# Patient Record
Sex: Male | Born: 1990 | Race: Black or African American | Hispanic: No | Marital: Single | Smoking: Never smoker
Health system: Southern US, Community
[De-identification: ages and names within clinical notes are randomized; demographics above are authoritative.]

## PROBLEM LIST (undated history)

## (undated) DIAGNOSIS — S83519A Sprain of anterior cruciate ligament of unspecified knee, initial encounter: Secondary | ICD-10-CM

## (undated) DIAGNOSIS — Z789 Other specified health status: Secondary | ICD-10-CM

## (undated) DIAGNOSIS — N6009 Solitary cyst of unspecified breast: Secondary | ICD-10-CM

## (undated) HISTORY — PX: NO PAST SURGERIES: SHX2092

---
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
## 2011-03-03 ENCOUNTER — Emergency Department (HOSPITAL_COMMUNITY)
Admission: EM | Admit: 2011-03-03 | Discharge: 2011-03-03 | Disposition: A | Discharge status: Home / Self Care | Payer: Medicaid Other | Attending: Emergency Medicine | Admitting: Emergency Medicine

## 2011-03-03 ENCOUNTER — Emergency Department (HOSPITAL_COMMUNITY): Payer: Medicaid Other

## 2011-03-03 DIAGNOSIS — W28XXXA Contact with powered lawn mower, initial encounter: Secondary | ICD-10-CM | POA: Insufficient documentation

## 2011-03-03 DIAGNOSIS — S62639A Displaced fracture of distal phalanx of unspecified finger, initial encounter for closed fracture: Secondary | ICD-10-CM | POA: Insufficient documentation

## 2011-03-03 DIAGNOSIS — Y92009 Unspecified place in unspecified non-institutional (private) residence as the place of occurrence of the external cause: Secondary | ICD-10-CM | POA: Insufficient documentation

## 2011-03-03 DIAGNOSIS — S6000XA Contusion of unspecified finger without damage to nail, initial encounter: Secondary | ICD-10-CM | POA: Insufficient documentation

## 2011-04-16 LAB — POCT RAPID STREP A: Streptococcus, Group A Screen (Direct): NEGATIVE

## 2011-08-14 ENCOUNTER — Emergency Department (HOSPITAL_COMMUNITY)
Admission: EM | Admit: 2011-08-14 | Discharge: 2011-08-14 | Disposition: A | Discharge status: Home / Self Care | Payer: Medicaid Other | Attending: Emergency Medicine | Admitting: Emergency Medicine

## 2011-08-14 ENCOUNTER — Encounter (HOSPITAL_COMMUNITY): Payer: Self-pay

## 2011-08-14 DIAGNOSIS — M79609 Pain in unspecified limb: Secondary | ICD-10-CM | POA: Insufficient documentation

## 2011-08-14 DIAGNOSIS — L600 Ingrowing nail: Secondary | ICD-10-CM

## 2011-08-14 MED ORDER — SILVER NITRATE-POT NITRATE 75-25 % EX MISC
1.0000 | Freq: Once | CUTANEOUS | Status: AC
  Administered 2011-08-14: 1 via TOPICAL
  Filled 2011-08-14: qty 2

## 2011-08-14 MED ORDER — TRAMADOL HCL 50 MG PO TABS: 50.0000 mg | ORAL_TABLET | Freq: Four times a day (QID) | ORAL | Status: AC | PRN

## 2011-08-14 MED ORDER — LIDOCAINE HCL 2 % IJ SOLN
20.0000 mL | Freq: Once | INTRAMUSCULAR | Status: AC
  Administered 2011-08-14: 400 mg
  Filled 2011-08-14: qty 1

## 2011-08-14 NOTE — ED Notes (Signed)
Pt reports ingrown toe -rt foot great toe for several months- denies injury

## 2011-08-14 NOTE — ED Provider Notes (Signed)
History     CSN: [id number]  Arrival date & time 08/14/11  1330   First MD Initiated Contact with Patient 08/14/11 1345      Chief Complaint  Patient presents with  . Toe Pain    (Consider location/radiation/quality/duration/timing/severity/associated sxs/prior treatment) Patient is a 21 y.o. male presenting with toe pain. The history is provided by the patient.  Toe Pain This is a recurrent problem. The current episode started 1 to 4 weeks ago. The problem occurs constantly. The problem has been gradually worsening. Pertinent negatives include no chills, fever, joint swelling, numbness, rash or weakness. The symptoms are aggravated by walking. He has tried nothing for the symptoms.  Hx ingrown toenails with intermittent pain. Currently with drainage from right great toe.  History reviewed. No pertinent past medical history.  History reviewed. No pertinent past surgical history.  No family history on file.  History  Substance Use Topics  . Smoking status: Never Smoker   . Smokeless tobacco: Not on file  . Alcohol Use: No      Review of Systems  Constitutional: Negative for fever and chills.  Musculoskeletal: Negative for joint swelling and gait problem.       Right great toe pain  Skin: Positive for color change. Negative for rash.  Neurological: Negative for weakness and numbness.  All other systems reviewed and are negative.    Allergies  Review of patient's allergies indicates no known allergies.  Home Medications  No current outpatient prescriptions on file.  BP 141/76  Pulse 79  Temp(Src) 98.3 F (36.8 C) (Oral)  Resp 21  Wt 350 lb (158.759 kg)  SpO2 99%  Physical Exam  Nursing note and vitals reviewed. Constitutional: He is oriented to person, place, and time. He appears well-developed and well-nourished.       obese  HENT:  Head: Normocephalic and atraumatic.  Right Ear: External ear normal.  Left Ear: External ear normal.  Eyes: Pupils are  equal, round, and reactive to light.  Neck: Neck supple.  Cardiovascular: Normal rate and regular rhythm.   Pulmonary/Chest: Effort normal. No respiratory distress.  Abdominal: Soft. He exhibits no distension. There is no tenderness.  Musculoskeletal: Normal range of motion.       Right great toe pain with edema around nailbed- see skin exam. No other areas with tenderness edema.  Neurological: He is alert and oriented to person, place, and time.       Sensation intact to light touch  Skin: Skin is warm.       Right great toe with ingrown toenail to lateral aspect with purulent material at the cuticle. Granulation tissue to lateral cuticle.    ED Course  NERVE BLOCK Date/Time: 08/14/2011 3:20 PM Performed by: Lorenz Coaster JUSTICE Authorized by: Lorenz Coaster JUSTICE Consent: Verbal consent obtained. Risks and benefits: risks, benefits and alternatives were discussed Consent given by: patient Patient understanding: patient states understanding of the procedure being performed Patient identity confirmed: verbally with patient Time out: Immediately prior to procedure a "time out" was called to verify the correct patient, procedure, equipment, support staff and site/side marked as required. Indications: debridement Nerve block body site: right great toe. Patient sedated: no Patient position: sitting Needle gauge: 25 G Location technique: anatomical landmarks Local anesthetic: lidocaine 2% without epinephrine Anesthetic total: 2.5 ml Outcome: pain improved Patient tolerance: Patient tolerated the procedure well with no immediate complications. Comments: Re-injection required with subsequent adequate anesthesia  NAIL REMOVAL Date/Time: 08/14/2011 3:20 PM Performed by: Artis Flock,  Allanna Bresee JUSTICE Authorized by: Lorenz Coaster JUSTICE Consent: Verbal consent obtained. Risks and benefits: risks, benefits and alternatives were discussed Consent given by: patient Patient  understanding: patient states understanding of the procedure being performed Patient identity confirmed: verbally with patient Time out: Immediately prior to procedure a "time out" was called to verify the correct patient, procedure, equipment, support staff and site/side marked as required. Location: right foot Location details: right big toe Anesthesia: digital block Local anesthetic: lidocaine 2% without epinephrine Anesthetic total: 2.5 ml Patient sedated: no Preparation: skin prepped with alcohol Amount removed: 1/4 Nail removed location: lateral. Wedge excision of skin of nail fold: no Nail bed sutured: no Removed nail replaced and anchored: no Dressing: dressing applied Patient tolerance: Patient tolerated the procedure well with no immediate complications. Comments: Bleeding controlled with silver nitrate applied post-procedure. Granulation tissue from lateral nailbed removed   (including critical care time)  Labs Reviewed - No data to display No results found.   1. Ingrown right big toenail       MDM  Ingrown toenail. Partial nail removal, tolerated well. Discussed wound care with pt.        [address] [place], [place] 08/15/11 [phone]

## 2011-08-14 NOTE — ED Notes (Signed)
2nd site- ingrown toe to left great toe however "not as severe"

## 2011-08-15 NOTE — ED Provider Notes (Signed)
Medical screening examination/treatment/procedure(s) were performed by non-physician practitioner and as supervising physician I was immediately available for consultation/collaboration.  Nicholes Stairs, MD 08/15/11 1511

## 2011-08-20 ENCOUNTER — Encounter (HOSPITAL_COMMUNITY): Payer: Self-pay | Admitting: Emergency Medicine

## 2011-08-20 ENCOUNTER — Emergency Department (HOSPITAL_COMMUNITY)
Admission: EM | Admit: 2011-08-20 | Discharge: 2011-08-20 | Disposition: A | Discharge status: Home / Self Care | Payer: Medicaid Other | Attending: Emergency Medicine | Admitting: Emergency Medicine

## 2011-08-20 DIAGNOSIS — R000 Tachycardia, unspecified: Secondary | ICD-10-CM | POA: Insufficient documentation

## 2011-08-20 DIAGNOSIS — J3489 Other specified disorders of nose and nasal sinuses: Secondary | ICD-10-CM | POA: Insufficient documentation

## 2011-08-20 DIAGNOSIS — R599 Enlarged lymph nodes, unspecified: Secondary | ICD-10-CM | POA: Insufficient documentation

## 2011-08-20 DIAGNOSIS — J020 Streptococcal pharyngitis: Secondary | ICD-10-CM | POA: Insufficient documentation

## 2011-08-20 DIAGNOSIS — R509 Fever, unspecified: Secondary | ICD-10-CM | POA: Insufficient documentation

## 2011-08-20 LAB — RAPID STREP SCREEN (MED CTR MEBANE ONLY): Streptococcus, Group A Screen (Direct): POSITIVE — AB

## 2011-08-20 MED ORDER — PENICILLIN G BENZATHINE & PROC 1200000 UNIT/2ML IM SUSP: 1.2000 10*6.[IU] | Freq: Once | INTRAMUSCULAR | Status: DC

## 2011-08-20 MED ORDER — PENICILLIN G BENZATHINE 1200000 UNIT/2ML IM SUSP
1.2000 10*6.[IU] | Freq: Once | INTRAMUSCULAR | Status: AC
  Administered 2011-08-20: 1.2 10*6.[IU] via INTRAMUSCULAR
  Filled 2011-08-20: qty 2

## 2011-08-20 MED ORDER — IBUPROFEN 800 MG PO TABS
800.0000 mg | ORAL_TABLET | Freq: Once | ORAL | Status: AC
  Administered 2011-08-20: 800 mg via ORAL
  Filled 2011-08-20: qty 1

## 2011-08-20 MED ORDER — HYDROCODONE-HOMATROPINE 5-1.5 MG/5ML PO SYRP: 5.0000 mL | ORAL_SOLUTION | Freq: Four times a day (QID) | ORAL | Status: AC | PRN

## 2011-08-20 NOTE — ED Notes (Signed)
Patient and family verbalized complete understanding of all the instructions

## 2011-08-20 NOTE — ED Notes (Signed)
Pt states he has a headache, fever, sore throat, and when he swallows it makes his ears hurt, and legs are achy  Sxs started yesterday
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
## 2011-08-20 NOTE — ED Provider Notes (Signed)
Medical screening examination/treatment/procedure(s) were performed by non-physician practitioner and as supervising physician I was immediately available for consultation/collaboration.   Dayton Bailiff, MD 08/20/11 2337

## 2011-12-31 ENCOUNTER — Emergency Department (HOSPITAL_COMMUNITY)
Admission: EM | Admit: 2011-12-31 | Discharge: 2011-12-31 | Disposition: A | Discharge status: Home / Self Care | Payer: Medicaid Other | Attending: Emergency Medicine | Admitting: Emergency Medicine

## 2011-12-31 ENCOUNTER — Encounter (HOSPITAL_COMMUNITY): Payer: Self-pay | Admitting: *Deleted

## 2011-12-31 DIAGNOSIS — L600 Ingrowing nail: Secondary | ICD-10-CM

## 2011-12-31 DIAGNOSIS — IMO0002 Reserved for concepts with insufficient information to code with codable children: Secondary | ICD-10-CM

## 2011-12-31 DIAGNOSIS — L03039 Cellulitis of unspecified toe: Secondary | ICD-10-CM | POA: Insufficient documentation

## 2011-12-31 MED ORDER — CLINDAMYCIN HCL 300 MG PO CAPS: 300.0000 mg | ORAL_CAPSULE | Freq: Three times a day (TID) | ORAL | Status: AC

## 2011-12-31 NOTE — ED Provider Notes (Signed)
History     CSN: [id number]  Arrival date & time 12/31/11  1924  11:03 PM HPI Patient reports chronic bilateral toe infection and ingrown toenail  Of his great toe. Reports both toe are mildly swollen and constantly draining. Denies other injury, or fever. Reports similar symptoms in January 2013.  Patient is a 21 y.o. male presenting with lower extremity pain.  Foot Pain This is a chronic problem. The current episode started more than 1 month ago. The problem occurs constantly. The problem has been gradually worsening. Pertinent negatives include no fever, joint swelling, numbness or weakness. The symptoms are aggravated by standing and walking. He has tried nothing for the symptoms.    History reviewed. No pertinent past medical history.  History reviewed. No pertinent past surgical history.  History reviewed. No pertinent family history.  History  Substance Use Topics  . Smoking status: Never Smoker   . Smokeless tobacco: Not on file  . Alcohol Use: No      Review of Systems  Constitutional: Negative for fever.  Musculoskeletal: Negative for joint swelling.       Toe pain and infection  Neurological: Negative for weakness and numbness.  All other systems reviewed and are negative.    Allergies  Review of patient's allergies indicates no known allergies.  Home Medications  No current outpatient prescriptions on file.  BP 139/65  Pulse 75  Temp 98.3 F (36.8 C) (Oral)  Resp 16  Ht 6\' 4"  (1.93 m)  Wt 347 lb (157.398 kg)  BMI 42.24 kg/m2  SpO2 100%  Physical Exam  Vitals reviewed. Constitutional: He appears well-developed and well-nourished.  HENT:  Head: Normocephalic and atraumatic.  Eyes: Conjunctivae are normal. Pupils are equal, round, and reactive to light.  Neck: Normal range of motion. Neck supple.  Abdominal: Soft. Bowel sounds are normal.  Musculoskeletal:       Bilateral ingrown toenails with draining paronychia. Mildly tender to palpation..  Trachea appears slightly macerated as if patient has been picking at it  Skin: Skin is warm and dry. No rash noted. No erythema. No pallor.  Psychiatric: He has a normal mood and affect. His behavior is normal.    ED Course  Procedures MDM   Patient has a paronychia that are already draining. Will place patient on antibiotics if this is a chronic toe infection for several months. Also will have patient referred to a podiatrist to followup with. Mother and patient voiced understanding and are ready for discharge      Thomasene Lot, Cordelia Poche 12/31/11 2349

## 2011-12-31 NOTE — ED Notes (Signed)
Pt reports rt toe pain s/p ingrown toenail procedure x2 months ago, pt also w/ redness and drainage to rt toe - pt also reporting left toe pain as well.

## 2011-12-31 NOTE — Discharge Instructions (Signed)
Infected Ingrown Toenail  An infected ingrown toenail occurs when the nail edge grows into the skin and bacteria invade the area. Symptoms include pain, tenderness, swelling, and pus drainage from the edge of the nail. Poorly fitting shoes, minor injuries, and improper cutting of the toenail may also contribute to the problem. You should cut your toenails squarely instead of rounding the edges. Do not cut them too short. Avoid tight or pointed toe shoes. Sometimes the ingrown portion of the nail must be removed. If your toenail is removed, it can take 3-4 months for it to re-grow.  HOME CARE INSTRUCTIONS     Soak your infected toe in warm water for 20-30 minutes, 2 to 3 times a day.    Packing or dressings applied to the area should be changed daily.    Take medicine as directed and finish them.    Reduce activities and keep your foot elevated when able to reduce swelling and discomfort. Do this until the infection gets better.    Wear sandals or go barefoot as much as possible while the infected area is sensitive.    See your caregiver for follow-up care in 2-3 days if the infection is not better.   SEEK MEDICAL CARE IF:    Your toe is becoming more red, swollen or painful.  MAKE SURE YOU:     Understand these instructions.    Will watch your condition.    Will get help right away if you are not doing well or get worse.   Document Released: 08/09/2004 Document Revised: 06/21/2011 Document Reviewed: 06/28/2008  [employer] Patient Information 2012 [employer]

## 2012-01-01 NOTE — ED Provider Notes (Signed)
Medical screening examination/treatment/procedure(s) were performed by non-physician practitioner and as supervising physician I was immediately available for consultation/collaboration.   Hurman Horn, MD 01/01/12 [phone]

## 2013-06-02 ENCOUNTER — Ambulatory Visit (INDEPENDENT_AMBULATORY_CARE_PROVIDER_SITE_OTHER): Payer: Medicaid Other | Admitting: Podiatry

## 2013-06-02 ENCOUNTER — Encounter: Payer: Self-pay | Admitting: Podiatry

## 2013-06-02 VITALS — BP 142/88 | HR 78 | Resp 16 | Ht 75.0 in | Wt 360.0 lb

## 2013-06-02 DIAGNOSIS — L600 Ingrowing nail: Secondary | ICD-10-CM

## 2013-06-02 MED ORDER — NEOMYCIN-POLYMYXIN-HC 3.5-10000-1 OT SOLN: OTIC | Status: DC

## 2013-06-02 NOTE — Patient Instructions (Signed)

## 2013-06-02 NOTE — Progress Notes (Signed)
  Subjective:    Patient ID: Bryce Hilt., male    DOB: [birth year], 22 y.o.   MRN: [id number]  HPI Comments: N infected ingrown toenail, pus blood  L left great lateral corner  D 2-3 months O  C about the same  A if it gets hit  T on antibiotic       Review of Systems  All other systems reviewed and are negative.       Objective:   Physical Exam I have reviewed his past medical history medications and allergies. His review of systems is unremarkable. Vital signs are stable he is alert and oriented x3. Lower extremity exam reveals strong palpable pulses bilateral foot. Neurologic sensorium is intact per Semmes-Weinstein monofilament. Deep tendon reflexes are intact and brisk bilateral. Muscle strength + over 5 dorsiflexors plantar flexors inverters and evertors are intact. Intrinsic musculature is intact. Orthopedic evaluation demonstrates mild pes planus noted bilateral. Cutaneous evaluation demonstrates supple well hydrated cutis no erythema edema cellulitis drainage or odor with exception of the fibular border hallux left. This does demonstrate a sharp incurvated nail margin along the fibular border of the hallux left. Sharp incurvated nail margin also demonstrating erythema granulation tissue purulence and malodor.        Assessment & Plan:  Assessment: Ingrown nail paronychia abscess hallux left fibular border.  Plan: Matrixectomy was performed to the fibular border of the hallux left today. This was performed after 3 cc of a 50-50 mixture of Marcaine plain and lidocaine plain was infiltrated in a hallux block left. The toe was then prepped and draped in is normal sterile fashion. Matrixectomy was performed by splitting the nail from distal to proximal along the fibular border. All necrotic tissue was sharply resected. 3 applications of phenol were applied to the matrix and nailbed. It was neutralized with isopropyl alcohol. Silvadene cream Telfa pad and a dry sterile  compressive dressing was applied. He was her soaking on a twice a day basis and Betadine and water starting tomorrow. He was given a prescription for Cortisporin Otic and a sheet for soaking instructions. I will followup with him in one week.

## 2013-06-09 ENCOUNTER — Ambulatory Visit: Payer: Medicaid Other | Admitting: Podiatry

## 2014-11-09 ENCOUNTER — Encounter (HOSPITAL_COMMUNITY): Payer: Self-pay | Admitting: Emergency Medicine

## 2014-11-09 ENCOUNTER — Emergency Department (HOSPITAL_COMMUNITY)
Admission: EM | Admit: 2014-11-09 | Discharge: 2014-11-09 | Disposition: A | Discharge status: Home / Self Care | Payer: Medicaid Other | Attending: Emergency Medicine | Admitting: Emergency Medicine

## 2014-11-09 DIAGNOSIS — M546 Pain in thoracic spine: Secondary | ICD-10-CM | POA: Insufficient documentation

## 2014-11-09 DIAGNOSIS — R109 Unspecified abdominal pain: Secondary | ICD-10-CM | POA: Insufficient documentation

## 2014-11-09 LAB — URINALYSIS, ROUTINE W REFLEX MICROSCOPIC
BILIRUBIN URINE: NEGATIVE
Glucose, UA: NEGATIVE mg/dL
HGB URINE DIPSTICK: NEGATIVE
KETONES UR: NEGATIVE mg/dL
LEUKOCYTES UA: NEGATIVE
Nitrite: NEGATIVE
PROTEIN: NEGATIVE mg/dL
SPECIFIC GRAVITY, URINE: 1.024 (ref 1.005–1.030)
UROBILINOGEN UA: 0.2 mg/dL (ref 0.0–1.0)
pH: 6.5 (ref 5.0–8.0)

## 2014-11-09 LAB — BASIC METABOLIC PANEL
Anion gap: 3 — ABNORMAL LOW (ref 5–15)
BUN: 17 mg/dL (ref 6–23)
CALCIUM: 9.2 mg/dL (ref 8.4–10.5)
CHLORIDE: 109 mmol/L (ref 96–112)
CO2: 24 mmol/L (ref 19–32)
Creatinine, Ser: 0.97 mg/dL (ref 0.50–1.35)
GFR calc non Af Amer: >90 mL/min (ref >90)
Glucose, Bld: 105 mg/dL — ABNORMAL HIGH (ref 70–99)
Potassium: 4 mmol/L (ref 3.5–5.1)
Sodium: 136 mmol/L (ref 135–145)

## 2014-11-09 LAB — CBC
HEMATOCRIT: 44.7 % (ref 39.0–52.0)
Hemoglobin: 15.3 g/dL (ref 13.0–17.0)
MCH: 29.9 pg (ref 26.0–34.0)
MCHC: 34.2 g/dL (ref 30.0–36.0)
MCV: 87.5 fL (ref 78.0–100.0)
Platelets: 189 10*3/uL (ref 150–400)
RBC: 5.11 MIL/uL (ref 4.22–5.81)
RDW: 13.1 % (ref 11.5–15.5)
WBC: 9.7 10*3/uL (ref 4.0–10.5)

## 2014-11-09 MED ORDER — METHOCARBAMOL 500 MG PO TABS: 500.0000 mg | ORAL_TABLET | Freq: Two times a day (BID) | ORAL | Status: DC

## 2014-11-09 MED ORDER — ONDANSETRON HCL 4 MG/2ML IJ SOLN
4.0000 mg | Freq: Once | INTRAMUSCULAR | Status: AC
  Administered 2014-11-09: 4 mg via INTRAVENOUS
  Filled 2014-11-09: qty 2

## 2014-11-09 MED ORDER — IBUPROFEN 600 MG PO TABS: 600.0000 mg | ORAL_TABLET | Freq: Four times a day (QID) | ORAL | Status: DC | PRN

## 2014-11-09 MED ORDER — KETOROLAC TROMETHAMINE 30 MG/ML IJ SOLN
30.0000 mg | Freq: Once | INTRAMUSCULAR | Status: AC
  Administered 2014-11-09: 30 mg via INTRAVENOUS
  Filled 2014-11-09: qty 1

## 2014-11-09 NOTE — ED Provider Notes (Signed)
CSN: [id number]     Arrival date & time 11/09/14  0702 History   First MD Initiated Contact with Patient 11/09/14 [phone]     Chief Complaint  Patient presents with  . Flank Pain     (Consider location/radiation/quality/duration/timing/severity/associated sxs/prior Treatment) HPI Comments: Patient is a 24 yo M presenting to the ED for evaluation for left sided flank pain with radiation into his abdomen that has been waxing and waning since Sunday. Describes it as sharp. Tried taking a vicodin with no improvement of symptoms. No modifying factors identified. Denies any fevers, chills, nausea, vomiting, diarrhea, constipation, urinary symptoms, shortness of breath. No abdominal surgical history.  Patient is a 24 y.o. male presenting with flank pain.  Flank Pain    History reviewed. No pertinent past medical history. History reviewed. No pertinent past surgical history. History reviewed. No pertinent family history. History  Substance Use Topics  . Smoking status: Never Smoker   . Smokeless tobacco: Never Used  . Alcohol Use: No    Review of Systems  Genitourinary: Positive for flank pain.  All other systems reviewed and are negative.     Allergies  Review of patient's allergies indicates no known allergies.  Home Medications   Prior to Admission medications   Medication Sig Start Date End Date Taking? Authorizing Provider  ibuprofen (ADVIL,MOTRIN) 600 MG tablet Take 1 tablet (600 mg total) by mouth every 6 (six) hours as needed. 11/09/14   Houston Surges, PA-C  methocarbamol (ROBAXIN) 500 MG tablet Take 1 tablet (500 mg total) by mouth 2 (two) times daily. 11/09/14   Francee PiccoloJennifer Averleigh Savary, PA-C  neomycin-polymyxin-hydrocortisone (CORTISPORIN) otic solution 1-2 drops to toe after soaking twice daily Patient not taking: Reported on 11/09/2014 06/02/13   Max T Hyatt, DPM   BP 146/81 mmHg  Pulse 65  Temp(Src) 98.2 F (36.8 C) (Oral)  Resp 16  Ht 6\' 3"  (1.905 m)  Wt 365 lb  (165.563 kg)  BMI 45.62 kg/m2  SpO2 97% Physical Exam  Constitutional: He is oriented to person, place, and time. He appears well-developed and well-nourished. No distress.  HENT:  Head: Normocephalic and atraumatic.  Right Ear: External ear normal.  Left Ear: External ear normal.  Nose: Nose normal.  Mouth/Throat: Oropharynx is clear and moist.  Eyes: Conjunctivae are normal.  Neck: Normal range of motion. Neck supple.  No nuchal rigidity.   Cardiovascular: Normal rate, regular rhythm and normal heart sounds.   Pulmonary/Chest: Effort normal and breath sounds normal. No respiratory distress.  Abdominal: Soft. Bowel sounds are normal. There is no tenderness.  Musculoskeletal: Normal range of motion.       Thoracic back: He exhibits no bony tenderness and no deformity.       Back:  Neurological: He is alert and oriented to person, place, and time.  Skin: Skin is warm and dry. He is not diaphoretic.  Psychiatric: He has a normal mood and affect.  Nursing note and vitals reviewed.   ED Course  Procedures (including critical care time) Medications  ondansetron (ZOFRAN) injection 4 mg (4 mg Intravenous Given 11/09/14 0755)  ketorolac (TORADOL) 30 MG/ML injection 30 mg (30 mg Intravenous Given 11/09/14 0756)    Labs Review Labs Reviewed  BASIC METABOLIC PANEL - Abnormal; Notable for the following:    Glucose, Bld 105 (*)    Anion gap 3 (*)    All other components within normal limits  URINE CULTURE  URINALYSIS, ROUTINE W REFLEX MICROSCOPIC  CBC    Imaging Review  No results found.   EKG Interpretation None      MDM   Final diagnoses:  Left flank pain   Filed Vitals:   11/09/14 0906  BP: 146/81  Pulse: 65  Temp: 98.2 F (36.8 C)  Resp: 16   Afebrile, NAD, non-toxic appearing, AAOx4.  I have reviewed nursing notes, vital signs, and all appropriate lab and imaging results for this patient.  Abdomen soft, nontender, nondistended. No CVA tenderness. Left sided  mid back tenderness appreciated. No bony tenderness. No evidence of UTI on UA. No hemoglobin in UA to suggest kidney stone. No red flags for back pain, likely musculoskeletal in nature. Pain managed and improved in emergency department. Return precautions discussed. Advised PCP follow-up. Patient is agreeable to plan. Patient is stable at time of discharge     Francee Piccolo, PA-C 11/09/14 0940  Lorre Nick, MD 11/11/14 [phone]

## 2014-11-09 NOTE — ED Notes (Signed)
Pt denies pain or n/v during and post-void.

## 2014-11-09 NOTE — ED Notes (Signed)
Pt states he has pain in his left flank area that radiates across his abdomen  Pt states the pain is sharp and is constant  Pt states the pain started on Sunday night  Pt states it was so bad earlier this morning he could not stand up straight  Denies N/V/D

## 2014-11-09 NOTE — Discharge Instructions (Signed)
Please follow up with your primary care physician in 1-2 days. If you do not have one please call the [facility] number listed above. Please take pain medication and/or muscle relaxants as prescribed and as needed for pain. Please do not drive on narcotic pain medication or on muscle relaxants. Please read all discharge instructions and return precautions.  ° ° °Flank Pain °Flank pain refers to pain that is located on the side of the body between the upper abdomen and the back. The pain may occur over a short period of time (acute) or may be long-term or reoccurring (chronic). It may be mild or severe. Flank pain can be caused by many things. °CAUSES  °Some of the more common causes of flank pain include: °· Muscle strains.   °· Muscle spasms.   °· A disease of your spine (vertebral disk disease).   °· A lung infection (pneumonia).   °· Fluid around your lungs (pulmonary edema).   °· A kidney infection.   °· Kidney stones.   °· A very painful skin rash caused by the chickenpox virus (shingles).   °· Gallbladder disease.   °HOME CARE INSTRUCTIONS  °Home care will depend on the cause of your pain. In general, °· Rest as directed by your caregiver. °· Drink enough fluids to keep your urine clear or pale yellow. °· Only take over-the-counter or prescription medicines as directed by your caregiver. Some medicines may help relieve the pain. °· Tell your caregiver about any changes in your pain. °· Follow up with your caregiver as directed. °SEEK IMMEDIATE MEDICAL CARE IF:  °· Your pain is not controlled with medicine.   °· You have new or worsening symptoms. °· Your pain increases.   °· You have abdominal pain.   °· You have shortness of breath.   °· You have persistent nausea or vomiting.   °· You have swelling in your abdomen.   °· You feel faint or pass out.   °· You have blood in your urine. °· You have a fever or persistent symptoms for more than 2-3 days. °· You have a fever and your symptoms  suddenly get worse. °MAKE SURE YOU:  °· Understand these instructions. °· Will watch your condition. °· Will get help right away if you are not doing well or get worse. °Document Released: 08/23/2005 Document Revised: 03/26/2012 Document Reviewed: 02/14/2012 °ExitCare® Patient Information ©2015 [employer]. This information is not intended to replace advice given to you by your health care provider. Make sure you discuss any questions you have with your health care provider. ° ° ° °

## 2014-11-10 LAB — URINE CULTURE
COLONY COUNT: NO GROWTH
CULTURE: NO GROWTH

## 2014-12-11 ENCOUNTER — Emergency Department (HOSPITAL_COMMUNITY)
Admission: EM | Admit: 2014-12-11 | Discharge: 2014-12-11 | Disposition: A | Discharge status: Home / Self Care | Payer: Medicaid Other | Attending: Emergency Medicine | Admitting: Emergency Medicine

## 2014-12-11 ENCOUNTER — Encounter (HOSPITAL_COMMUNITY): Payer: Self-pay | Admitting: Emergency Medicine

## 2014-12-11 DIAGNOSIS — L02411 Cutaneous abscess of right axilla: Secondary | ICD-10-CM

## 2014-12-11 DIAGNOSIS — Z79899 Other long term (current) drug therapy: Secondary | ICD-10-CM | POA: Insufficient documentation

## 2014-12-11 MED ORDER — IBUPROFEN 800 MG PO TABS
800.0000 mg | ORAL_TABLET | Freq: Once | ORAL | Status: AC
  Administered 2014-12-11: 800 mg via ORAL
  Filled 2014-12-11: qty 1

## 2014-12-11 MED ORDER — LIDOCAINE-PRILOCAINE 2.5-2.5 % EX CREA
TOPICAL_CREAM | Freq: Once | CUTANEOUS | Status: AC
  Administered 2014-12-11: 13:00:00 via TOPICAL
  Filled 2014-12-11: qty 5

## 2014-12-11 NOTE — ED Provider Notes (Signed)
CSN: [id number]     Arrival date & time 12/11/14  1215 History  This chart was scribed for Bryce Piccolo, PA-C working with Arby Barrette, MD by Elveria Rising, ED Scribe. This patient was seen in room WTR7/WTR7 and the patient's care was started at 12:34 PM.   Chief Complaint  Patient presents with  . Animal Bite   Patient is a 24 y.o. male presenting with abscess. The history is provided by the patient. No language interpreter was used.  Abscess Location:  Shoulder/arm Shoulder/arm abscess location:  R axilla Abscess quality: painful   Abscess quality: not draining   Duration:  3 days Context: not diabetes and not insect bite/sting   Associated symptoms: no fever, no nausea and no vomiting   Risk factors: no prior abscess    HPI Comments: Bryce Carroll is a 24 y.o. male who presents to the Emergency Department complaining of abscess to right axillary region which developed 3-4 days ago. Patient reports pain and swelling at the site; exacerbated pain with movement. Patient states that he's puncture the area with needles. Patient denies drainage, fever, chest pain, nausea or vomiting. Patient denies chronic illnesses.   History reviewed. No pertinent past medical history. History reviewed. No pertinent past surgical history. No family history on file. History  Substance Use Topics  . Smoking status: Never Smoker   . Smokeless tobacco: Never Used  . Alcohol Use: No    Review of Systems  Constitutional: Negative for fever.  Cardiovascular: Negative for chest pain.  Gastrointestinal: Negative for nausea and vomiting.  Skin:       Abscess   All other systems reviewed and are negative.     Allergies  Review of patient's allergies indicates no known allergies.  Home Medications   Prior to Admission medications   Medication Sig Start Date End Date Taking? Authorizing Provider  ibuprofen (ADVIL,MOTRIN) 600 MG tablet Take 1 tablet (600 mg total) by mouth every 6 (six)  hours as needed. 11/09/14   Sybella Harnish, PA-C  methocarbamol (ROBAXIN) 500 MG tablet Take 1 tablet (500 mg total) by mouth 2 (two) times daily. 11/09/14   Bryce Piccolo, PA-C  neomycin-polymyxin-hydrocortisone (CORTISPORIN) otic solution 1-2 drops to toe after soaking twice daily Patient not taking: Reported on 11/09/2014 06/02/13   Max T Hyatt, DPM   Triage Vitals: BP 158/74 mmHg  Pulse 94  Temp(Src) 98.1 F (36.7 C) (Oral)  Resp 16  SpO2 98% Physical Exam  Constitutional: He is oriented to person, place, and time. He appears well-developed and well-nourished. No distress.  HENT:  Head: Normocephalic and atraumatic.  Eyes: Conjunctivae and EOM are normal.  Neck: Neck supple. No tracheal deviation present.  Cardiovascular: Normal rate, regular rhythm and normal heart sounds.   Pulmonary/Chest: Effort normal and breath sounds normal. No respiratory distress.  Musculoskeletal: Normal range of motion.  Neurological: He is alert and oriented to person, place, and time.  Skin: Skin is warm and dry.     Psychiatric: He has a normal mood and affect. His behavior is normal.  Nursing note and vitals reviewed.   ED Course  Procedures (including critical care time) Medications  lidocaine-prilocaine (EMLA) cream ( Topical Given 12/11/14 1257)  ibuprofen (ADVIL,MOTRIN) tablet 800 mg (800 mg Oral Given 12/11/14 1253)    COORDINATION OF CARE: 12:37 PM- Discussed treatment plan with patient at bedside and patient agreed to plan.   Labs Review Labs Reviewed - No data to display  Imaging Review No results found.  EKG Interpretation None      INCISION AND DRAINAGE Performed by: Bryce PiccoloPIEPENBRINK, Lummie Montijo L Consent: Verbal consent obtained. Risks and benefits: risks, benefits and alternatives were discussed Type: abscess  Body area: right axilla  Anesthesia: topical  Incision was made with a scalpel.  Local anesthetic: EMLA  Anesthetic total: NA  Complexity:  complex Blunt dissection to break up loculations  Drainage: purulent  Drainage amount: copious  Packing material: NA  Patient tolerance: Patient tolerated the procedure well with no immediate complications.    MDM   Final diagnoses:  Abscess of right axilla    Filed Vitals:   12/11/14 1226  BP: 158/74  Pulse: 94  Temp: 98.1 F (36.7 C)  Resp: 16   Afebrile, NAD, non-toxic appearing, AAOx4.   Patient with skin abscess amenable to incision and drainage.  Abscess was not large enough to warrant packing or drain,  wound recheck in 2 days. Encouraged home warm soaks and flushing.  Mild signs of cellulitis is surrounding skin.  Will d/c to home.  No antibiotic therapy is indicated.   I personally performed the services described in this documentation, which was scribed in my presence. The recorded information has been reviewed and is accurate.     Bryce PiccoloJennifer Alita Waldren, PA-C 12/11/14 1340  Arby BarretteMarcy Pfeiffer, MD 12/12/14 [phone]

## 2014-12-11 NOTE — ED Notes (Signed)
Pt c/o possible animal bite after sleeping on a relative's floor three days ago. Pt concerned it was a mouse. Pt sts he woke up and his shirt was "eaten through" and he has had a red bump under his R arm ever since. Pt c/o 8/10 pain when moving his R arm. Pt A&Ox4 and ambulatory. No other complaints at this time.

## 2014-12-11 NOTE — Discharge Instructions (Signed)
Please follow up with your primary care physician in 1-2 days. If you do not have one [facility]. Please read all discharge instructions and return precautions.    Abscess Care After An abscess (also called a boil or furuncle) is an infected area that contains a collection of pus. Signs and symptoms of an abscess include pain, tenderness, redness, or hardness, or you may feel a moveable soft area under your skin. An abscess can occur anywhere in the body. The infection may spread to surrounding tissues causing cellulitis. A cut (incision) by the surgeon was made over your abscess and the pus was drained out. Gauze may have been packed into the space to provide a drain that will allow the cavity to heal from the inside outwards. The boil may be painful for 5 to 7 days. Most people with a boil do not have high fevers. Your abscess, if seen early, may not have localized, and may not have been lanced. If not, another appointment may be required for this if it does not get better on its own or with medications. HOME CARE INSTRUCTIONS   Only take over-the-counter or prescription medicines for pain, discomfort, or fever as directed by your caregiver.  When you bathe, soak and then remove gauze or iodoform packs at least daily or as directed by your caregiver. You may then wash the wound gently with mild soapy water. Repack with gauze or do as your caregiver directs. SEEK IMMEDIATE MEDICAL CARE IF:   You develop increased pain, swelling, redness, drainage, or bleeding in the wound site.  You develop signs of generalized infection including muscle aches, chills, fever, or a general ill feeling.  An oral temperature above 102 F (38.9 C) develops, not controlled by medication. See your caregiver for a recheck if you develop any of the symptoms described above. If medications (antibiotics) were prescribed, take them as directed. Document Released:  01/18/2005 Document Revised: 09/24/2011 Document Reviewed: 09/15/2007 [facility] Patient Information 2015 [place], [place]. This information is not intended to replace advice given to you by your health care provider. Make sure you discuss any questions you have with your health care provider.

## 2014-12-19 ENCOUNTER — Encounter (HOSPITAL_COMMUNITY): Payer: Self-pay | Admitting: Emergency Medicine

## 2014-12-19 ENCOUNTER — Inpatient Hospital Stay (HOSPITAL_COMMUNITY)
Admission: EM | Admit: 2014-12-19 | Discharge: 2014-12-20 | DRG: 603 | Disposition: A | Discharge status: Home / Self Care | Payer: Self-pay | Attending: Internal Medicine | Admitting: Internal Medicine

## 2014-12-19 DIAGNOSIS — Z6841 Body Mass Index (BMI) 40.0 and over, adult: Secondary | ICD-10-CM

## 2014-12-19 DIAGNOSIS — L02213 Cutaneous abscess of chest wall: Principal | ICD-10-CM | POA: Diagnosis present

## 2014-12-19 DIAGNOSIS — L0291 Cutaneous abscess, unspecified: Secondary | ICD-10-CM | POA: Insufficient documentation

## 2014-12-19 DIAGNOSIS — L0390 Cellulitis, unspecified: Secondary | ICD-10-CM

## 2014-12-19 DIAGNOSIS — E669 Obesity, unspecified: Secondary | ICD-10-CM | POA: Diagnosis present

## 2014-12-19 DIAGNOSIS — L03313 Cellulitis of chest wall: Secondary | ICD-10-CM | POA: Diagnosis present

## 2014-12-19 HISTORY — DX: Solitary cyst of unspecified breast: N60.09

## 2014-12-19 HISTORY — DX: Other specified health status: Z78.9

## 2014-12-19 LAB — CBC WITH DIFFERENTIAL/PLATELET
Basophils Absolute: 0 10*3/uL (ref 0.0–0.1)
Basophils Relative: 0 % (ref 0–1)
Eosinophils Absolute: 0.1 10*3/uL (ref 0.0–0.7)
Eosinophils Relative: 1 % (ref 0–5)
HEMATOCRIT: 43.1 % (ref 39.0–52.0)
Hemoglobin: 15 g/dL (ref 13.0–17.0)
Lymphocytes Relative: 19 % (ref 12–46)
Lymphs Abs: 3.2 10*3/uL (ref 0.7–4.0)
MCH: 30 pg (ref 26.0–34.0)
MCHC: 34.8 g/dL (ref 30.0–36.0)
MCV: 86.2 fL (ref 78.0–100.0)
Monocytes Absolute: 1.7 10*3/uL — ABNORMAL HIGH (ref 0.1–1.0)
Monocytes Relative: 10 % (ref 3–12)
Neutro Abs: 11.7 10*3/uL — ABNORMAL HIGH (ref 1.7–7.7)
Neutrophils Relative %: 70 % (ref 43–77)
Platelets: 229 10*3/uL (ref 150–400)
RBC: 5 MIL/uL (ref 4.22–5.81)
RDW: 12.9 % (ref 11.5–15.5)
WBC: 16.7 10*3/uL — AB (ref 4.0–10.5)

## 2014-12-19 LAB — BASIC METABOLIC PANEL
Anion gap: 11 (ref 5–15)
BUN: 14 mg/dL (ref 6–20)
CALCIUM: 9.2 mg/dL (ref 8.9–10.3)
CHLORIDE: 101 mmol/L (ref 101–111)
CO2: 24 mmol/L (ref 22–32)
Creatinine, Ser: 1.03 mg/dL (ref 0.61–1.24)
GFR calc Af Amer: >60 mL/min (ref >60)
GFR calc non Af Amer: >60 mL/min (ref >60)
Glucose, Bld: 89 mg/dL (ref 65–99)
POTASSIUM: 4.6 mmol/L (ref 3.5–5.1)
Sodium: 136 mmol/L (ref 135–145)

## 2014-12-19 LAB — MRSA PCR SCREENING: MRSA by PCR: NEGATIVE

## 2014-12-19 MED ORDER — ENOXAPARIN SODIUM 40 MG/0.4ML ~~LOC~~ SOLN
40.0000 mg | SUBCUTANEOUS | Status: DC
  Administered 2014-12-19: 40 mg via SUBCUTANEOUS
  Filled 2014-12-19 (×2): qty 0.4

## 2014-12-19 MED ORDER — SODIUM CHLORIDE 0.9 % IV SOLN
1250.0000 mg | Freq: Three times a day (TID) | INTRAVENOUS | Status: DC
  Administered 2014-12-20 (×2): 1250 mg via INTRAVENOUS
  Filled 2014-12-19 (×4): qty 1250

## 2014-12-19 MED ORDER — ONDANSETRON HCL 4 MG/2ML IJ SOLN: 4.0000 mg | Freq: Four times a day (QID) | INTRAMUSCULAR | Status: DC | PRN

## 2014-12-19 MED ORDER — ACETAMINOPHEN 325 MG PO TABS: 650.0000 mg | ORAL_TABLET | Freq: Four times a day (QID) | ORAL | Status: DC | PRN

## 2014-12-19 MED ORDER — ONDANSETRON HCL 4 MG PO TABS: 4.0000 mg | ORAL_TABLET | Freq: Four times a day (QID) | ORAL | Status: DC | PRN

## 2014-12-19 MED ORDER — LIDOCAINE-EPINEPHRINE (PF) 2 %-1:200000 IJ SOLN
20.0000 mL | Freq: Once | INTRAMUSCULAR | Status: AC
  Administered 2014-12-19: 20 mL
  Filled 2014-12-19: qty 20

## 2014-12-19 MED ORDER — TRAMADOL HCL 50 MG PO TABS
50.0000 mg | ORAL_TABLET | Freq: Four times a day (QID) | ORAL | Status: DC | PRN
  Administered 2014-12-19: 50 mg via ORAL
  Filled 2014-12-19: qty 1

## 2014-12-19 MED ORDER — ACETAMINOPHEN 650 MG RE SUPP: 650.0000 mg | Freq: Four times a day (QID) | RECTAL | Status: DC | PRN

## 2014-12-19 MED ORDER — SODIUM CHLORIDE 0.9 % IV SOLN
INTRAVENOUS | Status: DC
Start: 2014-12-19 — End: 2014-12-20
  Administered 2014-12-20: 10:00:00 via INTRAVENOUS

## 2014-12-19 MED ORDER — VANCOMYCIN HCL 10 G IV SOLR
2000.0000 mg | Freq: Once | INTRAVENOUS | Status: AC
  Administered 2014-12-19: 2000 mg via INTRAVENOUS
  Filled 2014-12-19: qty 2000

## 2014-12-19 NOTE — Progress Notes (Signed)
ANTIBIOTIC CONSULT NOTE - INITIAL  Pharmacy Consult for Vancomycin Indication: Cellulitis  No Known Allergies  Patient Measurements: Height: 6\' 3"  (190.5 cm) Weight: (!) 365 lb (165.563 kg) IBW/kg (Calculated) : 84.5  Vital Signs: Temp: 98.7 F (37.1 C) (06/05 1706) Temp Source: Oral (06/05 1706) BP: 145/80 mmHg (06/05 1706) Pulse Rate: 106 (06/05 1706) Intake/Output from previous day:   Intake/Output from this shift:    Labs: No results for input(s): WBC, HGB, PLT, LABCREA, CREATININE in the last 72 hours. CrCl cannot be calculated (Patient has no serum creatinine result on file.). No results for input(s): VANCOTROUGH, VANCOPEAK, VANCORANDOM, GENTTROUGH, GENTPEAK, GENTRANDOM, TOBRATROUGH, TOBRAPEAK, TOBRARND, AMIKACINPEAK, AMIKACINTROU, AMIKACIN in the last 72 hours.   Microbiology: No results found for this or any previous visit (from the past 720 hour(s)).  Medical History: Past Medical History  Diagnosis Date  . Cyst (solitary) of breast    Medications:  Scheduled:   Anti-infectives    Start     Dose/Rate Route Frequency Ordered Stop   12/19/14 1800  vancomycin (VANCOCIN) 2,000 mg in sodium chloride 0.9 % 500 mL IVPB     2,000 mg 250 mL/hr over 120 Minutes Intravenous  Once 12/19/14 1751       Assessment: 23 yoM with cellulitis of chest, axilla. Plan I&D of purulent area. Vancomycin dosing per Pharmacy.   Renal function adequate, patient obese  Goal of Therapy:  Vancomycin trough level 10-15 mcg/ml  Plan:   Follow 2gm Vancomycin with 1250mg  IV q8hr  Follow culture, renal function  Otho BellowsGreen, Demetrice Amstutz L PharmD Pager [phone] 12/19/2014, 6:01 PM

## 2014-12-19 NOTE — ED Notes (Signed)
Pt has noticed a red area to RT chest 2 days ago, has a Pimp like appearance with large 4x4 boarder of erythema to chest . Pain , denies an/v/d no fever. Has had this x1 in the past. Has not taken anything for area.

## 2014-12-19 NOTE — ED Notes (Signed)
Patient ready for I&D. Erythematous area to right side under axilla noted. Patient states area has progressively increased in size over the past three days. In no acute distress

## 2014-12-19 NOTE — H&P (Signed)
[facility] History and Physical  FREDRIC SLABACH UEA:[id number] DOB: 02-29-1991 DOA: 12/19/2014  Referring physician: Ms.Heather. PCP: Dorrene German, MD  Specialists: None.  Chief Complaint: Right chest wall cellulitis.  HPI: Bryce Carroll is a 24 y.o. male with no significant past medical history presents to the ER because of worsening swelling erythema around the right chest wall around his right breast. Patient had come to the ER 2 weeks ago with abscesses in his axilla which was drained. In the ER patient's right chest was found to be tender erythematous and had a small abscess which was drained. Since the area involved is large patient has been admitted for IV antibiotics. Patient at this time does not look toxic and is able to move his extremities without difficulty. There's mild tenderness on the right chest wall.   Review of Systems: As presented in the history of presenting illness, rest negative.  Past Medical History  Diagnosis Date  . Cyst (solitary) of breast   . Medical history non-contributory    Past Surgical History  Procedure Laterality Date  . No past surgeries     Social History:  reports that he has never smoked. He has never used smokeless tobacco. He reports that he does not drink alcohol or use illicit drugs. Where does patient live home. Can patient participate in ADLs? Yes.  No Known Allergies  Family History:  Family History  Problem Relation Age of Onset  . Diabetes Mellitus II Neg Hx       Prior to Admission medications   Medication Sig Start Date End Date Taking? Authorizing Provider  naproxen sodium (ANAPROX) 220 MG tablet Take 440 mg by mouth daily as needed (pain).   Yes Historical Provider, MD  ibuprofen (ADVIL,MOTRIN) 600 MG tablet Take 1 tablet (600 mg total) by mouth every 6 (six) hours as needed. Patient not taking: Reported on 12/19/2014 11/09/14   Francee Piccolo, PA-C  methocarbamol (ROBAXIN) 500 MG tablet Take 1 tablet  (500 mg total) by mouth 2 (two) times daily. Patient not taking: Reported on 12/19/2014 11/09/14   Francee Piccolo, PA-C  neomycin-polymyxin-hydrocortisone (CORTISPORIN) otic solution 1-2 drops to toe after soaking twice daily Patient not taking: Reported on 11/09/2014 06/02/13   Max Maud Deed, DPM    Physical Exam: Filed Vitals:   12/19/14 1426 12/19/14 1706 12/19/14 1934  BP: 136/72 145/80 145/67  Pulse: 98 106 99  Temp: 98.7 F (37.1 C) 98.7 F (37.1 C)   TempSrc: Oral Oral   Resp: Height:  (1.905 m)    Weight: 165.563 kg (365 lb)    SpO2: 100% 96% 98%     General:  Obese not in distress.  Eyes: Anicteric no pallor.  ENT: No discharge from the ears eyes nose and mouth.  Neck: No mass felt.  Cardiovascular: S1-S2 heard.  Respiratory: No rhonchi or crepitations.  Abdomen: Soft nontender bowel sounds present.  Skin: Right chest around the lateral aspect of his right breast is being dressed. There is mild tenderness around the surrounding skin area.  Musculoskeletal: No edema.  Psychiatric: Appears normal.  Neurologic: Alert awake oriented to time place and person. Moves all extremities.  Labs on Admission:  Basic Metabolic Panel:  Recent Labs Lab 12/19/14 1819  NA 136  K 4.6  CL 101  CO2 24  GLUCOSE 89  BUN 14  CREATININE 1.03  CALCIUM 9.2   Liver Function Tests: No results for input(s): AST, ALT, ALKPHOS, BILITOT, PROT, ALBUMIN  in the last 168 hours. No results for input(s): LIPASE, AMYLASE in the last 168 hours. No results for input(s): AMMONIA in the last 168 hours. CBC:  Recent Labs Lab 12/19/14 1819  WBC 16.7*  NEUTROABS 11.7*  HGB 15.0  HCT 43.1  MCV 86.2  PLT 229   Cardiac Enzymes: No results for input(s): CKTOTAL, CKMB, CKMBINDEX, TROPONINI in the last 168 hours.  BNP (last 3 results) No results for input(s): BNP in the last 8760 hours.  ProBNP (last 3 results) No results for input(s): PROBNP in the last 8760  hours.  CBG: No results for input(s): GLUCAP in the last 168 hours.  Radiological Exams on Admission: No results found.   Assessment/Plan Principal Problem:   Cellulitis of chest wall   1. Cellulitis of the chest wall, right side - patient has already had incision and drainage done in the ER by the ER physician. I have ordered CT chest to look for the extent of the abscess and if there is any remaining abscess may need further debridement. Patient does not look toxic at this time. I have placed patient on IV vancomycin for now. Unfortunately wound culture was not sent. Closely observe.   DVT Prophylaxis Lovenox.  Code Status: Full code.  Family Communication: Discussed with patient.  Disposition Plan: Admit to inpatient. Likely stay 2 days.    KAKRAKANDY,ARSHAD N. [facility] Pager [phone].  If 7PM-7AM, please contact night-coverage [email or web address] Password [email or web address] 12/19/2014, 9:03 PM

## 2014-12-19 NOTE — ED Provider Notes (Signed)
CSN: [id number]     Arrival date & time 12/19/14  1349 History   First MD Initiated Contact with Patient 12/19/14 1702     Chief Complaint  Patient presents with  . Rash     (Consider location/radiation/quality/duration/timing/severity/associated sxs/prior Treatment) HPI Comments: Patient with a history of Abscesses presents today with an abscess to the right lateral chest wall just lateral to the right breast.  He states that the abscess has been present for the past 2-3 days and is gradually increasing in size.  He has not noticed any drainage from the area.  No treatment prior to arrival.   He was seen on 12/11/14 for an abscess of a different location in the right axilla.  That abscess was incised and drained at that time.  Patient was not started on antibiotics.  He denies fever, chills, nausea, or vomiting.  He denies history of HIV or DM.    Patient is a 24 y.o. male presenting with rash. The history is provided by the patient.  Rash   Past Medical History  Diagnosis Date  . Cyst (solitary) of breast    History reviewed. No pertinent past surgical history. No family history on file. History  Substance Use Topics  . Smoking status: Never Smoker   . Smokeless tobacco: Never Used  . Alcohol Use: No    Review of Systems  Skin: Positive for color change.       abscess  All other systems reviewed and are negative.     Allergies  Review of patient's allergies indicates no known allergies.  Home Medications   Prior to Admission medications   Medication Sig Start Date End Date Taking? Authorizing Provider  naproxen sodium (ANAPROX) 220 MG tablet Take 440 mg by mouth daily as needed (pain).   Yes Historical Provider, MD  ibuprofen (ADVIL,MOTRIN) 600 MG tablet Take 1 tablet (600 mg total) by mouth every 6 (six) hours as needed. Patient not taking: Reported on 12/19/2014 11/09/14   Francee Piccolo, PA-C  methocarbamol (ROBAXIN) 500 MG tablet Take 1 tablet (500 mg total) by  mouth 2 (two) times daily. Patient not taking: Reported on 12/19/2014 11/09/14   Francee Piccolo, PA-C  neomycin-polymyxin-hydrocortisone (CORTISPORIN) otic solution 1-2 drops to toe after soaking twice daily Patient not taking: Reported on 11/09/2014 06/02/13   Max T Hyatt, DPM   BP 145/80 mmHg  Pulse 106  Temp(Src) 98.7 F (37.1 C) (Oral)  Resp 20  Ht  (1.905 m)  Wt 365 lb (165.563 kg)  BMI 45.62 kg/m2  SpO2 96% Physical Exam  Constitutional: He appears well-developed and well-nourished.  HENT:  Head: Normocephalic and atraumatic.  Mouth/Throat: Oropharynx is clear and moist.  Neck: Normal range of motion. Neck supple.  Cardiovascular: Normal rate, regular rhythm and normal heart sounds.   Pulmonary/Chest: Effort normal and breath sounds normal.    Neurological: He is alert.  Skin: Skin is warm and dry.  Psychiatric: He has a normal mood and affect.  Nursing note and vitals reviewed.   ED Course  Procedures (including critical care time) Labs Review Labs Reviewed - No data to display  Imaging Review No results found.   EKG Interpretation None     INCISION AND DRAINAGE Performed by: Santiago Glad Consent: Verbal consent obtained. Risks and benefits: risks, benefits and alternatives were discussed Type: abscess  Body area: right lateral chest  Anesthesia: local infiltration  Incision was made with a scalpel.  Local anesthetic: lidocaine 2% with epinephrine  Anesthetic  total: 9 ml  Complexity: complex Blunt dissection to break up loculations  Drainage: purulent  Drainage amount: large  Packing material: 1/4 in iodoform gauze  Patient tolerance: Patient tolerated the procedure well with no immediate complications.    MDM   Final diagnoses:  None   Patient presents today with an abscess of the right lateral chest wall.  Abscess with significant surrounding cellulitis.  Labs unremarkable aside from leukocytosis of 16.  Abscess incised  and drained in the ED.  Patient started on IV Vancomycin.  Due to the significant area of Cellulitis the patient was admitted to [facility] for further monitoring and management.      Santiago GladHeather Kandra Graven, PA-C 12/19/14 [id number]  Glynn OctaveStephen Rancour, MD 12/20/14 [phone]

## 2014-12-20 ENCOUNTER — Inpatient Hospital Stay (HOSPITAL_COMMUNITY): Payer: Self-pay

## 2014-12-20 DIAGNOSIS — L03313 Cellulitis of chest wall: Secondary | ICD-10-CM

## 2014-12-20 LAB — CBC WITH DIFFERENTIAL/PLATELET
Basophils Absolute: 0 10*3/uL (ref 0.0–0.1)
Basophils Relative: 0 % (ref 0–1)
Eosinophils Absolute: 0.1 10*3/uL (ref 0.0–0.7)
Eosinophils Relative: 1 % (ref 0–5)
HEMATOCRIT: 40.3 % (ref 39.0–52.0)
Hemoglobin: 13.3 g/dL (ref 13.0–17.0)
LYMPHS PCT: 26 % (ref 12–46)
Lymphs Abs: 3.2 10*3/uL (ref 0.7–4.0)
MCH: 28.8 pg (ref 26.0–34.0)
MCHC: 33 g/dL (ref 30.0–36.0)
MCV: 87.2 fL (ref 78.0–100.0)
MONO ABS: 1.3 10*3/uL — AB (ref 0.1–1.0)
MONOS PCT: 11 % (ref 3–12)
NEUTROS ABS: 7.7 10*3/uL (ref 1.7–7.7)
NEUTROS PCT: 62 % (ref 43–77)
Platelets: 192 10*3/uL (ref 150–400)
RBC: 4.62 MIL/uL (ref 4.22–5.81)
RDW: 12.9 % (ref 11.5–15.5)
WBC: 12.4 10*3/uL — AB (ref 4.0–10.5)

## 2014-12-20 LAB — BASIC METABOLIC PANEL
Anion gap: 10 (ref 5–15)
BUN: 13 mg/dL (ref 6–20)
CO2: 26 mmol/L (ref 22–32)
CREATININE: 0.91 mg/dL (ref 0.61–1.24)
Calcium: 9 mg/dL (ref 8.9–10.3)
Chloride: 104 mmol/L (ref 101–111)
GFR calc non Af Amer: >60 mL/min (ref >60)
Glucose, Bld: 93 mg/dL (ref 65–99)
POTASSIUM: 3.6 mmol/L (ref 3.5–5.1)
SODIUM: 140 mmol/L (ref 135–145)

## 2014-12-20 MED ORDER — TRAMADOL HCL 50 MG PO TABS: 50.0000 mg | ORAL_TABLET | Freq: Four times a day (QID) | ORAL | Status: DC | PRN

## 2014-12-20 MED ORDER — DOXYCYCLINE HYCLATE 50 MG PO CAPS: 100.0000 mg | ORAL_CAPSULE | Freq: Two times a day (BID) | ORAL | Status: DC

## 2014-12-20 NOTE — Discharge Instructions (Addendum)
Chlorhexidine topical antiseptic What is this medicine? CHLORHEXIDINE (klor HEX i deen) is used as a skin wound cleanser and a general skin cleanser. This medicine is also used as a surgical hand scrub and to cleanse the skin before surgery to help prevent infections. This medicine may be used for other purposes; ask your health care provider or pharmacist if you have questions. COMMON BRAND NAME(S): Betasept, Chlorostat, Hibiclens What should I tell my health care provider before I take this medicine? They need to know if you have any of the following conditions: -any skin rashes or problems -an unusual or allergic reaction to chlorhexidine, other medicines, foods, dyes, or preservatives -pregnant or trying to get pregnant -breast-feeding How should I use this medicine? This medicine is for external use only. Do not take by mouth. Follow the directions on the label or those given to you by your doctor or health care professional. Keep out of eyes, ears and mouth. This medicine should not be used as a preoperative skin preparation of the face or head. Talk to your pediatrician regarding the use of this medicine in children. While this medicine may be used for children for selected conditions, precautions do apply. Overdosage: If you think you have taken too much of this medicine contact a poison control center or emergency room at once. NOTE: This medicine is only for you. Do not share this medicine with others. What if I miss a dose? This does not apply; this medicine is not for regular use. What may interact with this medicine? Interactions are not expected. This list may not describe all possible interactions. Give your health care provider a list of all the medicines, herbs, non-prescription drugs, or dietary supplements you use. Also tell them if you smoke, drink alcohol, or use illegal drugs. Some items may interact with your medicine. What should I watch for while using this  medicine? This medicine may cause severe allergic reactions. Notify a health care professional right away if you think you are having an allergic reaction. Do not take this medicine by mouth. Avoid contact with your ears and eyes. If contact with the eyes occur, rinse the eyes well with plenty of cool tap water. What side effects may I notice from receiving this medicine? Side effects that you should report to your doctor or other health care professional as soon as possible: -allergic reactions like skin rash, itching or hives, swelling of the face, lips, or tongue -breathing problems -cough Side effects that usually do not require medical attention (report to your doctor or other health care professional if they continue or are bothersome): -increased sensitivity to the sun -skin irritation This list may not describe all possible side effects. Call your doctor for medical advice about side effects. You may report side effects to FDA at 1-800-FDA-1088. Where should I keep my medicine? Keep out of the reach of children. Store at room temperature between 15 and 30 degrees C (59 and 86 degrees F). Store away from direct light and heat. Do not freeze. Throw away any unused medicine after the expiration date. NOTE: This sheet is a summary. It may not cover all possible information. If you have questions about this medicine, talk to your doctor, pharmacist, or health care provider.  2015, Elsevier/Gold Standard. (2007-10-08 16:55:34)   Dressing Change A dressing is a material placed over wounds. It keeps the wound clean, dry, and protected from further injury. This provides an environment that favors wound healing.  BEFORE YOU BEGIN  Get  your supplies together. Things you may need include:  1/4 inch gauze packing strips  Tape.  Gloves.  Gauze squares.  Plastic bags.  Take pain medicine 30 minutes before the dressing change if you need it.  Take a shower before you do the first  dressing change of the day. Use plastic wrap or a plastic bag to prevent the dressing from getting wet. REMOVING YOUR OLD DRESSING   Wash your hands with soap and water. Dry your hands with a clean towel.  Put on your gloves.  Remove any tape.  Carefully remove the old dressing. If the dressing sticks, you may dampen it with warm water to loosen it, or follow your caregiver's specific directions.  Remove any gauze or packing tape that is in your wound.  Take a shower with soap and water  Take off your gloves.  Put the gloves, tape, gauze, or any packing tape into a plastic bag. CHANGING YOUR DRESSING  Open the supplies.  Open the gauze package so that the gauze remains on the inside of the package.  Put on your gloves.  Clean your wound as told by your caregiver.  If you have been told to keep your wound dry, follow those instructions.  Your caregiver may tell you to do one or more of the following:  Pick up the gauze. Pour the saline solution over the gauze. Squeeze out the extra saline solution.  Put medicated cream or other medicine on your wound if you have been told to do so.  Put the 1/4 inch gauze packing into the abscess wound cavity until you can not push anymore in.  Put dry gauze on your wound.  Repeat this twice a day, morning and night until the wound is flesh level  If you do not pack it, the abscess may come back  Tape the dry dressing in place.  Do not wrap the tape completely around the affected part (arm, leg, abdomen).  Take off your gloves. Put them in the plastic bag with the old dressing. Tie the bag shut and throw it away.  Keep the dressing clean and dry until your next dressing change.  Wash your hands. SEEK MEDICAL CARE IF:  Your skin around the wound looks red.  Your wound feels more tender or sore.  You see pus in the wound.  Your wound smells bad.  You have a fever.  Your skin around the wound has a rash that itches and  burns.  You see black or yellow skin in your wound that was not there before.  You feel nauseous, throw up, and feel very tired. Document Released: 08/09/2004 Document Revised: 09/24/2011 Document Reviewed: 05/14/2011 [facility] Patient Information 2015 [place], [place]. This information is not intended to replace advice given to you by your health care provider. Make sure you discuss any questions you have with your health care provider.

## 2014-12-20 NOTE — Discharge Summary (Signed)
PATIENT DETAILS Name: Bryce Carroll Age: 24 y.o. Sex: male Date of Birth: [birth year] MRN: [id number]. Admitting Physician: Eduard Clos, MD ION:[id number],XLKGM A, MD  Admit Date: 12/19/2014 Discharge date: 12/20/2014  Recommendations for Outpatient Follow-up:  1. Age appropriate general health maintenance 2. Ensure follow up with general surgery  PRIMARY DISCHARGE DIAGNOSIS:  Principal Problem:   Cellulitis of chest wall      PAST MEDICAL HISTORY: Past Medical History  Diagnosis Date  . Cyst (solitary) of breast   . Medical history non-contributory     DISCHARGE MEDICATIONS: Current Discharge Medication List    START taking these medications   Details  doxycycline (VIBRAMYCIN) 50 MG capsule Take 2 capsules (100 mg total) by mouth 2 (two) times daily. Qty: 28 capsule, Refills: 0    traMADol (ULTRAM) 50 MG tablet Take 1 tablet (50 mg total) by mouth every 6 (six) hours as needed for severe pain. Qty: 15 tablet, Refills: 0      STOP taking these medications     naproxen sodium (ANAPROX) 220 MG tablet      ibuprofen (ADVIL,MOTRIN) 600 MG tablet      methocarbamol (ROBAXIN) 500 MG tablet      neomycin-polymyxin-hydrocortisone (CORTISPORIN) otic solution         ALLERGIES:  No Known Allergies  BRIEF HPI:  See H&P, Labs, Consult and Test reports for all details in brief, patient is a 24 y.o. male with no significant past medical history presented to the ER because of worsening swelling erythema around the right chest wall around his right breast.Patient had come to the ER 2 weeks ago with abscesses in his axilla which was drained. In the ER patient's right chest was found to be tender erythematous and had a small abscess which was drained.  CONSULTATIONS:   general surgery  PERTINENT RADIOLOGIC STUDIES: No results found.   PERTINENT LAB RESULTS: CBC:  Recent Labs  12/19/14 1819 12/20/14 0520  WBC 16.7* 12.4*  HGB 15.0 13.3  HCT 43.1 40.3  PLT  229 192   CMET CMP     Component Value Date/Time   NA 140 12/20/2014 0520   K 3.6 12/20/2014 0520   CL 104 12/20/2014 0520   CO2 26 12/20/2014 0520   GLUCOSE 93 12/20/2014 0520   BUN 13 12/20/2014 0520   CREATININE 0.91 12/20/2014 0520   CALCIUM 9.0 12/20/2014 0520   GFRNONAA >60 12/20/2014 0520   GFRAA >60 12/20/2014 0520    GFR Estimated Creatinine Clearance: 210.5 mL/min (by C-G formula based on Cr of 0.91). No results for input(s): LIPASE, AMYLASE in the last 72 hours. No results for input(s): CKTOTAL, CKMB, CKMBINDEX, TROPONINI in the last 72 hours. Invalid input(s): POCBNP No results for input(s): DDIMER in the last 72 hours. No results for input(s): HGBA1C in the last 72 hours. No results for input(s): CHOL, HDL, LDLCALC, TRIG, CHOLHDL, LDLDIRECT in the last 72 hours. No results for input(s): TSH, T4TOTAL, T3FREE, THYROIDAB in the last 72 hours.  Invalid input(s): FREET3 No results for input(s): VITAMINB12, FOLATE, FERRITIN, TIBC, IRON, RETICCTPCT in the last 72 hours. Coags: No results for input(s): INR in the last 72 hours.  Invalid input(s): PT Microbiology: Recent Results (from the past 240 hour(s))  MRSA PCR Screening     Status: None   Collection Time: 12/19/14  8:30 PM  Result Value Ref Range Status   MRSA by PCR NEGATIVE NEGATIVE Final    Comment:  The GeneXpert MRSA Assay (FDA approved for NASAL specimens only), is one component of a comprehensive MRSA colonization surveillance program. It is not intended to diagnose MRSA infection nor to guide or monitor treatment for MRSA infections.      BRIEF HOSPITAL COURSE:   Principal Problem:   Abscess with Cellulitis of chest wall:Found to have a abscess that was drained in the ED, this morning found to be still oozing pus when squeezed. Gen Surgery was consulted who found that it was insufficiently packed. Patient was admitted-started on IV Vancomycin, the surrounding erythema had decreased  considerably-patient was non toxic and afebrile and was requesting discharge. Gen surgery has packed the wound, HHRN has been ordered. Recommendations are to place on doxycyline and discharge patient, outpatient follow up gen surgery has been arranged. Wound care instructions given to patient and his mother at bedside. Stable for discharge today.Please note that although CT Scan was contemplated-it was not done as felt that there was no indication for it at this time, as abscess was small and draining well.   TODAY-DAY OF DISCHARGE:  Subjective:   Welton Flakeshomas Blanton today has no headache,no chest abdominal pain,no new weakness tingling or numbness, feels much better wants to go home today.   Objective:   Blood pressure 125/81, pulse 91, temperature 98.7 F (37.1 C), temperature source Oral, resp. rate 20, height 6\' 3"  (1.905 m), weight 165.563 kg (365 lb), SpO2 100 %.  Intake/Output Summary (Last 24 hours) at 12/20/14 1334 Last data filed at 12/20/14 1037  Gross per 24 hour  Intake 1816.67 ml  Output      0 ml  Net 1816.67 ml   Filed Weights   12/19/14 1426  Weight: 165.563 kg (365 lb)    Exam Awake Alert, Oriented *3, No new F.N deficits, Normal affect [place].AT,PERRAL Supple Neck,No JVD, No cervical lymphadenopathy appriciated.  Symmetrical Chest wall movement, Good air movement bilaterally, CTAB RRR,No Gallops,Rubs or new Murmurs, No Parasternal Heave +ve B.Sounds, Abd Soft, Non tender, No organomegaly appriciated, No rebound -guarding or rigidity. No Cyanosis, Clubbing or edema, No new Rash or bruise  DISCHARGE CONDITION: Stable  DISPOSITION: Home with home health RN  DISCHARGE INSTRUCTIONS:    Activity:  As tolerated   Diet recommendation: Regular Diet  Discharge Instructions    Diet general    Complete by:  As directed      Discharge wound care:    Complete by:  As directed   Dressing Change A dressing is a material placed over wounds. It keeps the wound clean, dry,  and protected from further injury. This provides an environment that favors wound healing.  BEFORE YOU BEGIN Get your supplies together. Things you may need include: 1/4 inch gauze packing strips Tape. Gloves. Gauze squares. Plastic bags. Take pain medicine 30 minutes before the dressing change if you need it. Take a shower before you do the first dressing change of the day. Use plastic wrap or a plastic bag to prevent the dressing from getting wet. REMOVING YOUR OLD DRESSING  Wash your hands with soap and water. Dry your hands with a clean towel. Put on your gloves. Remove any tape. Carefully remove the old dressing. If the dressing sticks, you may dampen it with warm water to loosen it, or follow your caregiver's specific directions. Remove any gauze or packing tape that is in your wound. Take a shower with soap and water Take off your gloves. Put the gloves, tape, gauze, or any packing tape into a  plastic bag. CHANGING YOUR DRESSING Open the supplies. Open the gauze package so that the gauze remains on the inside of the package. Put on your gloves. Clean your wound as told by your caregiver. If you have been told to keep your wound dry, follow those instructions. Your caregiver may tell you to do one or more of the following: Pick up the gauze. Pour the saline solution over the gauze. Squeeze out the extra saline solution. Put medicated cream or other medicine on your wound if you have been told to do so. Put the 1/4 inch gauze packing into the abscess wound cavity until you can not push anymore in. Put dry gauze on your wound. Repeat this twice a day, morning and night until the wound is flesh level If you do not pack it, the abscess may come back Tape the dry dressing in place.  Do not wrap the tape completely around the affected part (arm, leg, abdomen). Take off your gloves. Put them in the plastic bag with the old dressing. Tie the bag shut and throw it away. Keep the  dressing clean and dry until your next dressing change. Wash your hands.     Increase activity slowly    Complete by:  As directed            Follow-up Information    Follow up with [facility] [place]. Go on 01/04/2015.   Why:  For post-hospital follow up regarding your right chest wall abscess at 2:45pm, please arrive by 2:15pm to check in and fill out paperwork.     Contact information:   [address] [place] [address] [phone]      Follow up with Dorrene German, MD. Schedule an appointment as soon as possible for a visit in 1 week.   Specialty:  Internal Medicine   Contact information:   [address] [place] [address] [phone]       Total Time spent on discharge equals 25  minutes.  SignedJeoffrey Massed 12/20/2014 1:34 PM

## 2014-12-20 NOTE — Consult Note (Signed)
[facility] Consult Note  Bryce Carroll [birth year]  [id number].    Requesting MD: Dr. Sloan Leiter Chief Complaint/Reason for Consult: Right chest wall abscess/cellulitis  HPI:  24 y/o AA obese male (BMI 92) without other significant PMH presents to [facility] with a right chest wall/axillary abscess.  He notes several days ago he laid on the floor of his grandma's house and when he woke up he found that his shirt sleeve had been eaten at.  He thinks it was a mouse bite.  He has noticed it gradually increase in size and become more red.  He denied any drainage from the area.  He notes peri-wound redness and swelling into his inferior aspect of the axilla.  He had a second lesion in his right axilla which he was seen for on 12/11/14, that lesion has improved after I&D.  He was not given any antibiotics.  He denies an fever/chills, CP/SOB, N/V/D or abdominal pain.  He notes having something like this only once before and says it was drained too.  He denies diabetes.  Denies family history.  Denies recent travel.  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  He says the only exposure he's been exposed to was fiberglass, no chemical exposure or insect bites.     ROS: All systems reviewed and otherwise negative except for as above  Family History  Problem Relation Age of Onset  . Diabetes Mellitus II Neg Hx     Past Medical History  Diagnosis Date  . Cyst (solitary) of breast   . Medical history non-contributory     Past Surgical History  Procedure Laterality Date  . No past surgeries      Social History:  reports that he has never smoked. He has never used smokeless tobacco. He reports that he does not drink alcohol or use illicit drugs.  Allergies: No Known Allergies  Medications Prior to Admission  Medication Sig Dispense Refill  . naproxen sodium (ANAPROX) 220 MG tablet Take 440 mg by mouth daily as needed (pain).    Marland Kitchen ibuprofen (ADVIL,MOTRIN) 600 MG  tablet Take 1 tablet (600 mg total) by mouth every 6 (six) hours as needed. (Patient not taking: Reported on 12/19/2014) 30 tablet 0  . methocarbamol (ROBAXIN) 500 MG tablet Take 1 tablet (500 mg total) by mouth 2 (two) times daily. (Patient not taking: Reported on 12/19/2014) 20 tablet 0  . neomycin-polymyxin-hydrocortisone (CORTISPORIN) otic solution 1-2 drops to toe after soaking twice daily (Patient not taking: Reported on 11/09/2014) 10 mL 2    Blood pressure 125/81, pulse 91, temperature 98.7 F (37.1 C), temperature source Oral, resp. rate 20, height 6' 3"  (1.905 m), weight 165.563 kg (365 lb), SpO2 100 %. Physical Exam: General: pleasant, WD/WN AA male who is laying in bed in NAD HEENT: head is normocephalic, atraumatic.  Sclera are noninjected.  PERRL.  Ears and nose without any masses or lesions.  Mouth is pink and moist Right chest wall/axilla:  Large area of erythema and induration in the left lateral chest wall and axilla, 2 cm incision site (by ER physician) with approximately 5cm wound cavity at deepest point.  There are multiple areas in all directions when I probed the wound with a q-tip applicator.  There is no fluctuance palpated and wound is draining purulent drainage well.  Right superior axilla with tiny nodule appears to be healing well without signs of infection. Heart: regular, rate, and rhythm.  No  obvious murmurs, gallops, or rubs noted.  Palpable pedal pulses bilaterally Lungs: CTAB, no wheezes, rhonchi, or rales noted.  Respiratory effort nonlabored Abd: soft, NT/ND, +BS, no masses, hernias, or organomegaly MS: all 4 extremities are symmetrical with no cyanosis, clubbing, or edema. Skin: warm and dry without lesions other than as above. Psych: A&Ox3 with an appropriate affect.   Results for orders placed or performed during the hospital encounter of 12/19/14 (from the past 48 hour(s))  CBC with Differential/Platelet     Status: Abnormal   Collection Time: 12/19/14  6:19  PM  Result Value Ref Range   WBC 16.7 (H) 4.0 - 10.5 K/uL   RBC 5.00 4.22 - 5.81 MIL/uL   Hemoglobin 15.0 13.0 - 17.0 g/dL   HCT 43.1 39.0 - 52.0 %   MCV 86.2 78.0 - 100.0 fL   MCH 30.0 26.0 - 34.0 pg   MCHC 34.8 30.0 - 36.0 g/dL   RDW 12.9 11.5 - 15.5 %   Platelets 229 150 - 400 K/uL   Neutrophils Relative % 70 43 - 77 %   Neutro Abs 11.7 (H) 1.7 - 7.7 K/uL   Lymphocytes Relative 19 12 - 46 %   Lymphs Abs 3.2 0.7 - 4.0 K/uL   Monocytes Relative 10 3 - 12 %   Monocytes Absolute 1.7 (H) 0.1 - 1.0 K/uL   Eosinophils Relative 1 0 - 5 %   Eosinophils Absolute 0.1 0.0 - 0.7 K/uL   Basophils Relative 0 0 - 1 %   Basophils Absolute 0.0 0.0 - 0.1 K/uL  Basic metabolic panel     Status: None   Collection Time: 12/19/14  6:19 PM  Result Value Ref Range   Sodium 136 135 - 145 mmol/L   Potassium 4.6 3.5 - 5.1 mmol/L   Chloride 101 101 - 111 mmol/L   CO2 24 22 - 32 mmol/L   Glucose, Bld 89 65 - 99 mg/dL   BUN 14 6 - 20 mg/dL   Creatinine, Ser 1.03 0.61 - 1.24 mg/dL   Calcium 9.2 8.9 - 10.3 mg/dL   GFR calc non Af Amer >60 >60 mL/min   GFR calc Af Amer >60 >60 mL/min    Comment: (NOTE) The eGFR has been calculated using the CKD EPI equation. This calculation has not been validated in all clinical situations. eGFR's persistently <60 mL/min signify possible Chronic Kidney Disease.    Anion gap 11 5 - 15  MRSA PCR Screening     Status: None   Collection Time: 12/19/14  8:30 PM  Result Value Ref Range   MRSA by PCR NEGATIVE NEGATIVE    Comment:        The GeneXpert MRSA Assay (FDA approved for NASAL specimens only), is one component of a comprehensive MRSA colonization surveillance program. It is not intended to diagnose MRSA infection nor to guide or monitor treatment for MRSA infections.   CBC WITH DIFFERENTIAL     Status: Abnormal   Collection Time: 12/20/14  5:20 AM  Result Value Ref Range   WBC 12.4 (H) 4.0 - 10.5 K/uL   RBC 4.62 4.22 - 5.81 MIL/uL   Hemoglobin 13.3  13.0 - 17.0 g/dL   HCT 40.3 39.0 - 52.0 %   MCV 87.2 78.0 - 100.0 fL   MCH 28.8 26.0 - 34.0 pg   MCHC 33.0 30.0 - 36.0 g/dL   RDW 12.9 11.5 - 15.5 %   Platelets 192 150 - 400 K/uL   Neutrophils Relative %  62 43 - 77 %   Neutro Abs 7.7 1.7 - 7.7 K/uL   Lymphocytes Relative 26 12 - 46 %   Lymphs Abs 3.2 0.7 - 4.0 K/uL   Monocytes Relative 11 3 - 12 %   Monocytes Absolute 1.3 (H) 0.1 - 1.0 K/uL   Eosinophils Relative 1 0 - 5 %   Eosinophils Absolute 0.1 0.0 - 0.7 K/uL   Basophils Relative 0 0 - 1 %   Basophils Absolute 0.0 0.0 - 0.1 K/uL  Basic metabolic panel     Status: None   Collection Time: 12/20/14  5:20 AM  Result Value Ref Range   Sodium 140 135 - 145 mmol/L   Potassium 3.6 3.5 - 5.1 mmol/L    Comment: DELTA CHECK NOTED REPEATED TO VERIFY    Chloride 104 101 - 111 mmol/L   CO2 26 22 - 32 mmol/L   Glucose, Bld 93 65 - 99 mg/dL   BUN 13 6 - 20 mg/dL   Creatinine, Ser 0.91 0.61 - 1.24 mg/dL   Calcium 9.0 8.9 - 10.3 mg/dL   GFR calc non Af Amer >60 >60 mL/min   GFR calc Af Amer >60 >60 mL/min    Comment: (NOTE) The eGFR has been calculated using the CKD EPI equation. This calculation has not been validated in all clinical situations. eGFR's persistently <60 mL/min signify possible Chronic Kidney Disease.    Anion gap 10 5 - 15   No results found.    Assessment/Plan Right Chest wall abscess/cellulitis Obesity BMI 45.62 Recent axillary abscess - now resolving  1.  The wound does not need any further surgical attention.  It is draining well, but insufficiently packed.  Packing changes BID with 1/4 inch non-iodoform gauze (if available) at discharge, it needs to be packed fully.  May need HH to help him if no family can be taught.  It would be hard for him to get to secondary to location.  It should be packed until wound cavity is flesh level. 2.  Ambulate and IS 3.  SCD's and lovenox being given 4.  May benefit from Hibiclens/chlorhexadine wash as a body wash to  extremities, torso, and axilla and exfoliation with a wash cloth every day to help prevent these frequent infections. 5.  Doxycycline is likely fine for 5-7 days 6.  Recommend showering after packing is removed and then repacking afterwards 7.  Follow up appointment schedueled with [place] on 01/04/15 at 2:45pm.     Nat Christen, [facility] 12/20/2014, 10:58 AM Pager: [phone]

## 2015-11-22 ENCOUNTER — Encounter (HOSPITAL_COMMUNITY): Payer: Self-pay

## 2015-11-22 ENCOUNTER — Emergency Department (HOSPITAL_COMMUNITY)
Admission: EM | Admit: 2015-11-22 | Discharge: 2015-11-22 | Disposition: A | Discharge status: Home / Self Care | Payer: MEDICAID | Attending: Emergency Medicine | Admitting: Emergency Medicine

## 2015-11-22 ENCOUNTER — Emergency Department (HOSPITAL_COMMUNITY): Payer: MEDICAID

## 2015-11-22 DIAGNOSIS — M2391 Unspecified internal derangement of right knee: Secondary | ICD-10-CM

## 2015-11-22 DIAGNOSIS — Y998 Other external cause status: Secondary | ICD-10-CM | POA: Insufficient documentation

## 2015-11-22 DIAGNOSIS — W1839XA Other fall on same level, initial encounter: Secondary | ICD-10-CM | POA: Insufficient documentation

## 2015-11-22 DIAGNOSIS — Y9289 Other specified places as the place of occurrence of the external cause: Secondary | ICD-10-CM | POA: Insufficient documentation

## 2015-11-22 DIAGNOSIS — Y9367 Activity, basketball: Secondary | ICD-10-CM | POA: Insufficient documentation

## 2015-11-22 DIAGNOSIS — R52 Pain, unspecified: Secondary | ICD-10-CM

## 2015-11-22 MED ORDER — METHOCARBAMOL 500 MG PO TABS: 500.0000 mg | ORAL_TABLET | Freq: Two times a day (BID) | ORAL | Status: DC

## 2015-11-22 MED ORDER — IBUPROFEN 800 MG PO TABS
800.0000 mg | ORAL_TABLET | Freq: Once | ORAL | Status: AC
  Administered 2015-11-22: 800 mg via ORAL
  Filled 2015-11-22: qty 1

## 2015-11-22 MED ORDER — IBUPROFEN 800 MG PO TABS: 800.0000 mg | ORAL_TABLET | Freq: Three times a day (TID) | ORAL | Status: DC

## 2015-11-22 NOTE — Discharge Instructions (Signed)

## 2015-11-22 NOTE — ED Notes (Signed)
Pt was playing basketball and twisted his right knee

## 2015-11-22 NOTE — ED Notes (Signed)
Bed: WA08 Expected date:  Expected time:  Means of arrival:  Comments: 

## 2015-11-22 NOTE — ED Provider Notes (Signed)
CSN: [id number]     Arrival date & time 11/22/15  [id number] History   First MD Initiated Contact with Patient 11/22/15 [phone]     Chief Complaint  Patient presents with  . Knee Pain     (Consider location/radiation/quality/duration/timing/severity/associated sxs/prior Treatment) HPI   25 year old male who presents for evaluation of right knee injury. Patient report last night he was playing basketball and after jumping he landed awkwardly on his right knee and fell down on the ground. Report acute onset of sharp throbbing pain to the anterior aspect of his right knee radiates towards his shin, worsening with movement. Pain is currently 9 out of 10. Patient unable to ambulate afterward. He denies injuring any other body part and no loss of consciousness. Denies having hip pain or ankle pain. Denies numbness. Patient does report feeling instability in his right knee. He denies any prior injury to this knee. He did took some of his mom's pain medication prior to arrival but states it provides no relief.  Past Medical History  Diagnosis Date  . Cyst (solitary) of breast   . Medical history non-contributory    Past Surgical History  Procedure Laterality Date  . No past surgeries     Family History  Problem Relation Age of Onset  . Diabetes Mellitus II Neg Hx    Social History  Substance Use Topics  . Smoking status: Never Smoker   . Smokeless tobacco: Never Used  . Alcohol Use: No    Review of Systems  Constitutional: Negative for fever.  Skin: Negative for rash and wound.  Neurological: Negative for numbness.      Allergies  Review of patient's allergies indicates no known allergies.  Home Medications   Prior to Admission medications   Medication Sig Start Date End Date Taking? Authorizing Provider  doxycycline (VIBRAMYCIN) 50 MG capsule Take 2 capsules (100 mg total) by mouth 2 (two) times daily. 12/20/14   Shanker Levora DredgeM Ghimire, MD  traMADol (ULTRAM) 50 MG tablet Take 1 tablet (50  mg total) by mouth every 6 (six) hours as needed for severe pain. 12/20/14   Shanker Levora DredgeM Ghimire, MD   BP 148/70 mmHg  Pulse 96  Temp(Src) 98.4 F (36.9 C) (Oral)  Resp 18  Ht 6\' 4"  (1.93 m)  Wt 161.027 kg  BMI 43.23 kg/m2  SpO2 99% Physical Exam  Constitutional: He appears well-developed and well-nourished. No distress.   African-American male, obese, laying in bed   HENT:  Head: Atraumatic.  Eyes: Conjunctivae are normal.  Neck: Neck supple.  Musculoskeletal: He exhibits tenderness (Right knee: Tenderness to both medial and lateral joint line on palpation. Decreased knee flexion secondary to pain. Swelling noted to the anterior aspect of knee. Negative ballottement. Negative anterior and posterior drawer tests and normal varus valgus.).  Exam is difficult due to large body habitus. Right hip and right ankle nontender.  Neurological: He is alert.  Skin: No rash noted.  Psychiatric: He has a normal mood and affect.  Nursing note and vitals reviewed.   ED Course  Procedures (including critical care time) Labs Review Labs Reviewed - No data to display  Imaging Review Dg Knee Complete 4 Views Right  11/22/2015  CLINICAL DATA:  Initial evaluation for acute trauma, fall. EXAM: RIGHT KNEE - COMPLETE 4+ VIEW COMPARISON:  None. FINDINGS: No acute fracture dislocation. No significant joint effusion. Joint spaces maintained. Enthesophyte noted at the superior pole patella. Osseous mineralization normal. No soft tissue abnormality. IMPRESSION: No acute osseous abnormality  about the knee. Electronically Signed   By: Rise Mu M.D.   On: 11/22/2015 04:01   I have personally reviewed and evaluated these images and lab results as part of my medical decision-making.   EKG Interpretation None      MDM   Final diagnoses:  Acute internal derangement of right knee    BP 148/70 mmHg  Pulse 96  Temp(Src) 98.4 F (36.9 C) (Oral)  Resp 18  Ht  (1.93 m)  Wt 161.027 kg  BMI  43.23 kg/m2  SpO2 99%   7:47 AM Patient had a mechanical injury to his right knee. X-ray of right knee shows no acute fractures or dislocation. Suspect internal derangement of his knee. I will provide Ace wrap, crutches, pain medication and orthopedic referral. Rice therapy discussed.  Fayrene Helper, PA-C 11/22/15 1610  Nelva Nay, MD 11/22/15 [id number]

## 2015-12-22 ENCOUNTER — Encounter (HOSPITAL_COMMUNITY): Payer: Self-pay | Admitting: Emergency Medicine

## 2015-12-22 ENCOUNTER — Emergency Department (HOSPITAL_COMMUNITY)
Admission: EM | Admit: 2015-12-22 | Discharge: 2015-12-22 | Disposition: A | Discharge status: Home / Self Care | Payer: MEDICAID | Attending: Emergency Medicine | Admitting: Emergency Medicine

## 2015-12-22 ENCOUNTER — Emergency Department (HOSPITAL_COMMUNITY): Payer: MEDICAID

## 2015-12-22 DIAGNOSIS — W1839XD Other fall on same level, subsequent encounter: Secondary | ICD-10-CM | POA: Insufficient documentation

## 2015-12-22 DIAGNOSIS — M25461 Effusion, right knee: Secondary | ICD-10-CM

## 2015-12-22 DIAGNOSIS — S8991XD Unspecified injury of right lower leg, subsequent encounter: Secondary | ICD-10-CM | POA: Insufficient documentation

## 2015-12-22 MED ORDER — METHOCARBAMOL 500 MG PO TABS: 500.0000 mg | ORAL_TABLET | Freq: Four times a day (QID) | ORAL | Status: DC | PRN

## 2015-12-22 MED ORDER — HYDROCODONE-ACETAMINOPHEN 5-325 MG PO TABS: 1.0000 | ORAL_TABLET | ORAL | Status: DC | PRN

## 2015-12-22 NOTE — ED Notes (Signed)
Pt reports he was diagnosed with a ligament injury in his R knee several weeks ago. Pt reports he fell yesterday after his knee gave out. Pt feels like knee will buckle when he tries to bear weight.

## 2015-12-22 NOTE — ED Provider Notes (Signed)
CSN: [id number]     Arrival date & time 12/22/15  1126 History  By signing my name below, I, Marisue Humble, attest that this documentation has been prepared under the direction and in the presence of non-physician practitioner, Trixie Dredge, PA-C. Electronically Signed: Marisue Humble, Scribe. 12/22/2015. 1:11 PM.     Chief Complaint  Patient presents with  . Leg Pain    The history is provided by the patient. No language interpreter was used.   HPI Comments:  Bryce Carroll is a 25 y.o. male who presents to the Emergency Department complaining of worsening sharp, generalized right knee pain s/p fall yesterday. Pt states his right knee gave out yesterday while walking down steps, causing him to fall down the last stair. Pain worsens with range of motion and putting weight on his right leg. Pt was evaluated in the ED ~ 1 month ago after falling on his right knee while playing basketball; x-rays of his right knee were unremarkable and he was treated for an acute internal derangement of his right knee. He has experienced continuing pain to his inner knee since the injury but has been able to walk. Pt has been wrapping and icing his knee since prior injury with mild relief; he also took his mother's pain medication last night. He scheduled an appointment yesterday with the orthopedist Dr. Ophelia Charter, but he forgot about the appointment. Denies weakness or numbness in his right foot, back pain, any other injuries, chest pain, fever, abdominal pain, or any other symptoms currently.    Past Medical History  Diagnosis Date  . Cyst (solitary) of breast   . Medical history non-contributory    Past Surgical History  Procedure Laterality Date  . No past surgeries     Family History  Problem Relation Age of Onset  . Diabetes Mellitus II Neg Hx    Social History  Substance Use Topics  . Smoking status: Never Smoker   . Smokeless tobacco: Never Used  . Alcohol Use: No    Review of Systems   Constitutional: Negative for fever and chills.  Cardiovascular: Negative for chest pain.  Gastrointestinal: Negative for nausea, vomiting, abdominal pain and diarrhea.  Musculoskeletal: Positive for joint swelling and arthralgias.  Skin: Negative for wound.  Allergic/Immunologic: Negative for immunocompromised state.  Neurological: Negative for weakness and numbness.  Hematological: Does not bruise/bleed easily.  Psychiatric/Behavioral: Negative for self-injury.    Allergies  Review of patient's allergies indicates no known allergies.  Home Medications   Prior to Admission medications   Medication Sig Start Date End Date Taking? Authorizing Provider  doxycycline (VIBRAMYCIN) 50 MG capsule Take 2 capsules (100 mg total) by mouth 2 (two) times daily. 12/20/14   Shanker Levora Dredge, MD  HYDROcodone-acetaminophen (NORCO/VICODIN) 5-325 MG tablet Take 1-2 tablets by mouth every 4 (four) hours as needed for moderate pain or severe pain. 12/22/15   Trixie Dredge, PA-C  ibuprofen (ADVIL,MOTRIN) 800 MG tablet Take 1 tablet (800 mg total) by mouth 3 (three) times daily. 11/22/15   Fayrene Helper, PA-C  methocarbamol (ROBAXIN) 500 MG tablet Take 1-2 tablets (500-1,000 mg total) by mouth every 6 (six) hours as needed for muscle spasms (and pain). 12/22/15   Trixie Dredge, PA-C  traMADol (ULTRAM) 50 MG tablet Take 1 tablet (50 mg total) by mouth every 6 (six) hours as needed for severe pain. 12/20/14   Shanker Levora Dredge, MD   BP 154/88 mmHg  Pulse 94  Temp(Src) 98.2 F (36.8 C) (Oral)  Resp  16  SpO2 98%   Physical Exam  Constitutional: He appears well-developed and well-nourished. No distress.  HENT:  Head: Normocephalic and atraumatic.  Neck: Neck supple.  Pulmonary/Chest: Effort normal.  Musculoskeletal:       Right knee: Tenderness found.  Right Knee: Diffuse tenderness anteriorly; decreased extension; flexes to 90 degrees; tenderness into right calf; distal pulses and sensation intact; pain with stress in  all directions, worst with valgus stress.  No skin changes.  No erythema or warmth.    Neurological: He is alert. He exhibits normal muscle tone.  Skin: He is not diaphoretic.  Nursing note and vitals reviewed.   ED Course  Procedures   DIAGNOSTIC STUDIES:  Oxygen Saturation is 98% on RA, normal by my interpretation.    COORDINATION OF CARE:  1:03 PM Will give pt another referral to Dr. Ophelia CharterYates. Will give pt knee sleeve. Discussed treatment plan with pt at bedside and pt agreed to plan.  Labs Review Labs Reviewed - No data to display  Imaging Review Dg Knee Complete 4 Views Right  12/22/2015  CLINICAL DATA:  Right knee injury playing basketball few weeks ago. Persistent pain medially and instability. EXAM: RIGHT KNEE - COMPLETE 4+ VIEW COMPARISON:  Plain film of the right knee dated 11/22/2015. FINDINGS: Osseous alignment is normal. Bone mineralization is normal. No fracture line or displaced fracture fragment seen. Joint effusion noted within the suprapatellar bursa. Incidental note again made of a small enthesophyte along the upper margin of the patella. IMPRESSION: 1. Joint effusion. 2. No osseous fracture or dislocation. Electronically Signed   By: Bary RichardStan  Maynard M.D.   On: 12/22/2015 12:17   I have personally reviewed and evaluated these images and lab results as part of my medical decision-making.   EKG Interpretation None      MDM   Final diagnoses:  Right knee injury, subsequent encounter  Knee effusion, right    Afebrile, nontoxic patient with injury to his right knee while walking down the stirs yesterday.  Pt had injury to the same knee approximately 1 month ago that had never improved and he had not yet seen orthopedist in follow up.  Suspect he may have worsened whatever tear or other injury he began one month ago.  Neurovascularly intact.   Xray demonstrates joint effusion.  No clinical e/o septic joint.   D/C home with mobic, norco, knee immobilizer. Pt has crutches  at home. Advised pt to  F/U with Dr Ophelia CharterYates (orthopedist to whom he was previously referred).  Discussed result, findings, treatment, and follow up  with patient.  Pt given return precautions.  Pt verbalizes understanding and agrees with plan.       I personally performed the services described in this documentation, which was scribed in my presence. The recorded information has been reviewed and is accurate.    Trixie Dredgemily Melina Mosteller, PA-C 12/22/15 1356  Melene Planan Floyd, DO 12/22/15 [phone]

## 2015-12-22 NOTE — ED Notes (Signed)
PT DECLINED CRUTCHES_has them at home

## 2015-12-22 NOTE — Discharge Instructions (Signed)
Read the information below.  Use the prescribed medication as directed.  Please discuss all new medications with your pharmacist.  Do not take additional tylenol while taking the prescribed pain medication to avoid overdose.  You may return to the Emergency Department at any time for worsening condition or any new symptoms that concern you.   If you develop uncontrolled pain, weakness or numbness of the extremity, severe discoloration of the skin, or you are unable to move your knee or walk, return to the ER for a recheck.      Knee Effusion Knee effusion means that you have excess fluid in your knee joint. This can cause pain and swelling in your knee. This may make your knee more difficult to bend and move. That is because there is increased pain and pressure in the joint. If there is fluid in your knee, it often means that something is wrong inside your knee, such as severe arthritis, abnormal inflammation, or an infection. Another common cause of knee effusion is an injury to the knee muscles, ligaments, or cartilage. HOME CARE INSTRUCTIONS  Use crutches as directed by your health care provider.  Wear a knee brace as directed by your health care provider.  Apply ice to the swollen area:  Put ice in a plastic bag.  Place a towel between your skin and the bag.  Leave the ice on for 20 minutes, 2-3 times per day.  Keep your knee raised (elevated) when you are sitting or lying down.  Take medicines only as directed by your health care provider.  Do any rehabilitation or strengthening exercises as directed by your health care provider.  Rest your knee as directed by your health care provider. You may start doing your normal activities again when your health care provider approves.   Keep all follow-up visits as directed by your health care provider. This is important. SEEK MEDICAL CARE IF:  You have ongoing (persistent) pain in your knee. SEEK IMMEDIATE MEDICAL CARE IF:  You have  increased swelling or redness of your knee.  You have severe pain in your knee.  You have a fever.   This information is not intended to replace advice given to you by your health care provider. Make sure you discuss any questions you have with your health care provider.   Document Released: 09/22/2003 Document Revised: 07/23/2014 Document Reviewed: 02/15/2014 Elsevier Interactive Patient Education [employer]2016 Elsevier Inc.

## 2015-12-22 NOTE — ED Notes (Signed)
Bed: WA29 Expected date:  Expected time:  Means of arrival:  Comments: 

## 2015-12-22 NOTE — ED Notes (Signed)
No answer x1.  Not in Xray either

## 2015-12-23 ENCOUNTER — Other Ambulatory Visit (HOSPITAL_COMMUNITY): Payer: Self-pay | Admitting: Sports Medicine

## 2015-12-23 DIAGNOSIS — M25561 Pain in right knee: Secondary | ICD-10-CM

## 2015-12-30 ENCOUNTER — Ambulatory Visit (HOSPITAL_COMMUNITY)
Admission: RE | Admit: 2015-12-30 | Discharge: 2015-12-30 | Disposition: A | Discharge status: Home / Self Care | Payer: Medicaid Other | Source: Ambulatory Visit | Attending: Sports Medicine | Admitting: Sports Medicine

## 2015-12-30 DIAGNOSIS — S83511A Sprain of anterior cruciate ligament of right knee, initial encounter: Secondary | ICD-10-CM | POA: Insufficient documentation

## 2015-12-30 DIAGNOSIS — X58XXXA Exposure to other specified factors, initial encounter: Secondary | ICD-10-CM | POA: Insufficient documentation

## 2015-12-30 DIAGNOSIS — M25461 Effusion, right knee: Secondary | ICD-10-CM | POA: Insufficient documentation

## 2015-12-30 DIAGNOSIS — R600 Localized edema: Secondary | ICD-10-CM | POA: Insufficient documentation

## 2015-12-30 DIAGNOSIS — M25561 Pain in right knee: Secondary | ICD-10-CM

## 2015-12-30 DIAGNOSIS — S82144A Nondisplaced bicondylar fracture of right tibia, initial encounter for closed fracture: Secondary | ICD-10-CM | POA: Insufficient documentation

## 2016-01-18 ENCOUNTER — Emergency Department (HOSPITAL_COMMUNITY)
Admission: EM | Admit: 2016-01-18 | Discharge: 2016-01-18 | Disposition: A | Discharge status: Home / Self Care | Payer: BLUE CROSS/BLUE SHIELD | Attending: Emergency Medicine | Admitting: Emergency Medicine

## 2016-01-18 ENCOUNTER — Encounter (HOSPITAL_COMMUNITY): Payer: Self-pay | Admitting: Emergency Medicine

## 2016-01-18 DIAGNOSIS — L0390 Cellulitis, unspecified: Secondary | ICD-10-CM

## 2016-01-18 DIAGNOSIS — L0291 Cutaneous abscess, unspecified: Secondary | ICD-10-CM

## 2016-01-18 DIAGNOSIS — Y939 Activity, unspecified: Secondary | ICD-10-CM | POA: Insufficient documentation

## 2016-01-18 DIAGNOSIS — L02414 Cutaneous abscess of left upper limb: Secondary | ICD-10-CM | POA: Diagnosis not present

## 2016-01-18 DIAGNOSIS — S50862A Insect bite (nonvenomous) of left forearm, initial encounter: Secondary | ICD-10-CM | POA: Diagnosis present

## 2016-01-18 DIAGNOSIS — W57XXXA Bitten or stung by nonvenomous insect and other nonvenomous arthropods, initial encounter: Secondary | ICD-10-CM | POA: Insufficient documentation

## 2016-01-18 DIAGNOSIS — Y929 Unspecified place or not applicable: Secondary | ICD-10-CM | POA: Diagnosis not present

## 2016-01-18 DIAGNOSIS — L03114 Cellulitis of left upper limb: Secondary | ICD-10-CM | POA: Insufficient documentation

## 2016-01-18 DIAGNOSIS — Y999 Unspecified external cause status: Secondary | ICD-10-CM | POA: Diagnosis not present

## 2016-01-18 HISTORY — DX: Sprain of anterior cruciate ligament of unspecified knee, initial encounter: S83.519A

## 2016-01-18 MED ORDER — LIDOCAINE HCL 1 % IJ SOLN
5.0000 mL | Freq: Once | INTRAMUSCULAR | Status: AC
  Administered 2016-01-18: 20 mL via INTRADERMAL
  Filled 2016-01-18: qty 20

## 2016-01-18 MED ORDER — DOXYCYCLINE HYCLATE 100 MG PO CAPS: 100.0000 mg | ORAL_CAPSULE | Freq: Two times a day (BID) | ORAL | Status: DC

## 2016-01-18 MED ORDER — HYDROCODONE-ACETAMINOPHEN 5-325 MG PO TABS: 1.0000 | ORAL_TABLET | Freq: Four times a day (QID) | ORAL | Status: DC | PRN

## 2016-01-18 NOTE — ED Notes (Signed)
Pt from home with complaints of what he believes is a spider bite on the inside of his left forearm. Pt states he was sitting in the grass working on a car  On 7/1 when he felt a sting on his arm. Pt states he "tried to pop it" and was unable to and so he came here. Pt rates his pain at 3/10. The area is red and swollen. Pt states he has had bloody drainage from the area when he tried to pop it, but aside from that he denies drainage.

## 2016-01-18 NOTE — Discharge Instructions (Signed)

## 2016-01-18 NOTE — ED Provider Notes (Signed)
CSN: [id number]     Arrival date & time 01/18/16  1659 History   By signing my name below, I, Marisue HumbleMichelle Chaffee, attest that this documentation has been prepared under the direction and in the presence of non-physician practitioner, Cheri FowlerKayla Natalee Tomkiewicz, PA-C. Electronically Signed: Marisue HumbleMichelle Chaffee, Scribe. 01/18/2016. 6:57 PM.    Chief Complaint  Patient presents with  . Insect Bite    The history is provided by the patient. No language interpreter was used.   HPI Comments:  Bryce Carroll is a 25 y.o. male who presents to the Emergency Department complaining of painful, red, swollen area on his inner left forearm. Pt is concerned for a spider bite; he was sitting in the grass 4 days ago and felt a sting in that area. He tried to pop the bump with a needle without drainage. Pt has cleaned the area with peroxide and applied neosporin. Denies fever, chills, numbness, nausea, or vomiting.   Past Medical History  Diagnosis Date  . Cyst (solitary) of breast   . Medical history non-contributory   . ACL (anterior cruciate ligament) tear right   Past Surgical History  Procedure Laterality Date  . No past surgeries     Family History  Problem Relation Age of Onset  . Diabetes Mellitus II Neg Hx    Social History  Substance Use Topics  . Smoking status: Never Smoker   . Smokeless tobacco: Never Used  . Alcohol Use: No    Review of Systems  Constitutional: Negative for fever and chills.  Gastrointestinal: Negative for nausea and vomiting.  Skin: Positive for wound (insect bite).  Neurological: Negative for numbness.   Allergies  Review of patient's allergies indicates no known allergies.  Home Medications   Prior to Admission medications   Medication Sig Start Date End Date Taking? Authorizing Provider  doxycycline (VIBRAMYCIN) 50 MG capsule Take 2 capsules (100 mg total) by mouth 2 (two) times daily. 12/20/14   Shanker Levora DredgeM Ghimire, MD  HYDROcodone-acetaminophen (NORCO/VICODIN) 5-325 MG  tablet Take 1-2 tablets by mouth every 4 (four) hours as needed for moderate pain or severe pain. 12/22/15   Trixie DredgeEmily West, PA-C  ibuprofen (ADVIL,MOTRIN) 800 MG tablet Take 1 tablet (800 mg total) by mouth 3 (three) times daily. 11/22/15   Fayrene HelperBowie Tran, PA-C  methocarbamol (ROBAXIN) 500 MG tablet Take 1-2 tablets (500-1,000 mg total) by mouth every 6 (six) hours as needed for muscle spasms (and pain). 12/22/15   Trixie DredgeEmily West, PA-C  traMADol (ULTRAM) 50 MG tablet Take 1 tablet (50 mg total) by mouth every 6 (six) hours as needed for severe pain. 12/20/14   Shanker Levora DredgeM Ghimire, MD   BP 158/93 mmHg  Pulse 76  Temp(Src) 99.2 F (37.3 C) (Oral)  Resp 18  Ht 6\' 4"  (1.93 m)  Wt 158.759 kg  BMI 42.62 kg/m2  SpO2 96%   Physical Exam  Constitutional: He is oriented to person, place, and time. He appears well-developed and well-nourished.  HENT:  Head: Normocephalic and atraumatic.  Right Ear: External ear normal.  Left Ear: External ear normal.  Eyes: Conjunctivae are normal. No scleral icterus.  Neck: No tracheal deviation present.  Cardiovascular:  Pulses:      Radial pulses are 2+ on the right side, and 2+ on the left side.  Pulmonary/Chest: Effort normal. No respiratory distress.  Abdominal: He exhibits no distension.  Musculoskeletal: Normal range of motion.  Compartment soft and compressible.  FAROM.  Compartment soft and compressible.   Neurological: He is  alert and oriented to person, place, and time.  Strength and sensation intact in BUE.   Skin: Skin is warm and dry.  Left volar forearm: Large area of warmth, erythema, and some induration.  1x2 cm area of fluctuance.  Psychiatric: He has a normal mood and affect. His behavior is normal.    ED Course  Procedures  DIAGNOSTIC STUDIES:  Oxygen Saturation is 96% on RA, normal by my interpretation.    COORDINATION OF CARE:  6:55 PM Will I&D. Discussed treatment plan with pt at bedside and pt agreed to plan.  INCISION AND DRAINAGE Performed  by: Marty Uy Consent: Cheri FowlerVerbal consent obtained. Risks and benefits: risks, benefits and alternatives were discussed Type: abscess  Body area: left forearm  Anesthesia: local infiltration  Incision was made with a scalpel.  Local anesthetic: lidocaine 1% without epinephrine  Anesthetic total: 5 ml  Complexity: complex Blunt dissection to break up loculations  Drainage: purulent  Drainage amount: mild  Packing material: none  Patient tolerance: Patient tolerated the procedure well with no immediate complications.     Labs Review Labs Reviewed - No data to display  Imaging Review No results found. I have personally reviewed and evaluated these images and lab results as part of my medical decision-making.   EKG Interpretation None      MDM   Final diagnoses:  Abscess and cellulitis    Patient presents with findings consistent with abscess and cellulitis.  No systemic symptoms.  Neurovascularly intact.  I&D performed.  Home with Doxycycline and Norco.  Return 2 days for wound check.  Discussed return precautions.  Patient agrees and acknowledges the above plan for discharge.   I personally performed the services described in this documentation, which was scribed in my presence. The recorded information has been reviewed and is accurate.    Cheri FowlerKayla Monserat Prestigiacomo, PA-C 01/18/16 1916  Nelva Nayobert Beaton, MD 01/18/16 2255

## 2016-01-21 ENCOUNTER — Emergency Department (HOSPITAL_COMMUNITY): Payer: BLUE CROSS/BLUE SHIELD

## 2016-01-21 ENCOUNTER — Encounter (HOSPITAL_COMMUNITY): Payer: Self-pay | Admitting: Emergency Medicine

## 2016-01-21 ENCOUNTER — Emergency Department (HOSPITAL_COMMUNITY)
Admission: EM | Admit: 2016-01-21 | Discharge: 2016-01-21 | Disposition: A | Discharge status: Home / Self Care | Payer: BLUE CROSS/BLUE SHIELD | Attending: Emergency Medicine | Admitting: Emergency Medicine

## 2016-01-21 DIAGNOSIS — L03114 Cellulitis of left upper limb: Secondary | ICD-10-CM | POA: Insufficient documentation

## 2016-01-21 DIAGNOSIS — Z79899 Other long term (current) drug therapy: Secondary | ICD-10-CM | POA: Diagnosis not present

## 2016-01-21 DIAGNOSIS — Z4800 Encounter for change or removal of nonsurgical wound dressing: Secondary | ICD-10-CM | POA: Diagnosis present

## 2016-01-21 LAB — SEDIMENTATION RATE: Sed Rate: 1 mm/hr (ref 0–16)

## 2016-01-21 LAB — CBC WITH DIFFERENTIAL/PLATELET
BASOS ABS: 0 10*3/uL (ref 0.0–0.1)
Basophils Relative: 0 %
Eosinophils Absolute: 0.2 10*3/uL (ref 0.0–0.7)
Eosinophils Relative: 1 %
HCT: 45.9 % (ref 39.0–52.0)
HEMOGLOBIN: 15.7 g/dL (ref 13.0–17.0)
LYMPHS ABS: 4 10*3/uL (ref 0.7–4.0)
LYMPHS PCT: 34 %
MCH: 29.3 pg (ref 26.0–34.0)
MCHC: 34.2 g/dL (ref 30.0–36.0)
MCV: 85.8 fL (ref 78.0–100.0)
Monocytes Absolute: 0.8 10*3/uL (ref 0.1–1.0)
Monocytes Relative: 7 %
NEUTROS ABS: 6.8 10*3/uL (ref 1.7–7.7)
Neutrophils Relative %: 58 %
PLATELETS: 218 10*3/uL (ref 150–400)
RBC: 5.35 MIL/uL (ref 4.22–5.81)
RDW: 13 % (ref 11.5–15.5)
WBC: 11.8 10*3/uL — AB (ref 4.0–10.5)

## 2016-01-21 LAB — BASIC METABOLIC PANEL
ANION GAP: 8 (ref 5–15)
BUN: 18 mg/dL (ref 6–20)
CHLORIDE: 105 mmol/L (ref 101–111)
CO2: 24 mmol/L (ref 22–32)
Calcium: 9.7 mg/dL (ref 8.9–10.3)
Creatinine, Ser: 1.05 mg/dL (ref 0.61–1.24)
GFR calc Af Amer: >60 mL/min (ref >60)
Glucose, Bld: 91 mg/dL (ref 65–99)
Potassium: 4 mmol/L (ref 3.5–5.1)
Sodium: 137 mmol/L (ref 135–145)

## 2016-01-21 MED ORDER — CLINDAMYCIN HCL 300 MG PO CAPS
300.0000 mg | ORAL_CAPSULE | Freq: Once | ORAL | Status: AC
  Administered 2016-01-21: 300 mg via ORAL
  Filled 2016-01-21: qty 1

## 2016-01-21 MED ORDER — CLINDAMYCIN HCL 150 MG PO CAPS: 300.0000 mg | ORAL_CAPSULE | Freq: Three times a day (TID) | ORAL | Status: DC

## 2016-01-21 MED ORDER — VANCOMYCIN HCL IN DEXTROSE 1-5 GM/200ML-% IV SOLN
1000.0000 mg | Freq: Once | INTRAVENOUS | Status: AC
  Administered 2016-01-21: 1000 mg via INTRAVENOUS
  Filled 2016-01-21: qty 200

## 2016-01-21 NOTE — ED Notes (Signed)
Pt reports understanding of discharge information. No questions at time of discharge 

## 2016-01-21 NOTE — ED Notes (Signed)
Pt her for recheck of ?spider bite to L forearm. Pt compliant with antibiotics. Zero pain.

## 2016-01-21 NOTE — Discharge Instructions (Signed)
Discontinue doxycycline. Start taking Clindamycin as prescribed. Take ibuprofen or tylenol for pain. Return to the ED for worsening redness or fever over 100.62F.  Cellulitis Cellulitis is an infection of the skin and the tissue beneath it. The infected area is usually red and tender. Cellulitis occurs most often in the arms and lower legs.  CAUSES  Cellulitis is caused by bacteria that enter the skin through cracks or cuts in the skin. The most common types of bacteria that cause cellulitis are staphylococci and streptococci. SIGNS AND SYMPTOMS   Redness and warmth.  Swelling.  Tenderness or pain.  Fever. DIAGNOSIS  Your health care provider can usually determine what is wrong based on a physical exam. Blood tests may also be done. TREATMENT  Treatment usually involves taking an antibiotic medicine. HOME CARE INSTRUCTIONS   Take your antibiotic medicine as directed by your health care provider. Finish the antibiotic even if you start to feel better.  Keep the infected arm or leg elevated to reduce swelling.  Apply a warm cloth to the affected area up to 4 times per day to relieve pain.  Take medicines only as directed by your health care provider.  Keep all follow-up visits as directed by your health care provider. SEEK MEDICAL CARE IF:   You notice red streaks coming from the infected area.  Your red area gets larger or turns dark in color.  Your bone or joint underneath the infected area becomes painful after the skin has healed.  Your infection returns in the same area or another area.  You notice a swollen bump in the infected area.  You develop new symptoms.  You have a fever. SEEK IMMEDIATE MEDICAL CARE IF:   You feel very sleepy.  You develop vomiting or diarrhea.  You have a general ill feeling (malaise) with muscle aches and pains.   This information is not intended to replace advice given to you by your health care provider. Make sure you discuss any  questions you have with your health care provider.   Document Released: 04/11/2005 Document Revised: 03/23/2015 Document Reviewed: 09/17/2011 Elsevier Interactive Patient Education [employer]

## 2016-01-21 NOTE — ED Provider Notes (Signed)
CSN: [id number]     Arrival date & time 01/21/16  1906 History  By signing my name below, I, Bryce Carroll, attest that this documentation has been prepared under the direction and in the presence of [employer], PA-C. Electronically Signed: Georgette Carroll, ED Scribe. 01/21/2016. 8:38 PM.   Chief Complaint  Patient presents with  . Wound Check   The history is provided by the patient. No language interpreter was used.    HPI Comments: Bryce Carroll is a 25 y.o. male who presents to the Emergency Department complaining for a inner left forearm wound check. Pt was seen in the ED on 01/18/2016 for an abscess on his left forearm which was treated through I&D and he was prescribed antibiotics. Pt was then told to return in 2 days to recheck the wound. There is still bloody drainage present and pt reports that the redness and firmness is spreading; however, he cannot tell if it has improved or gotten worse. Pt has been compliant with his doxycycline and presents with no pain at this time. He denies any recent fevers or chills. No limited ROM of the LUE. He denies any purulent drainage.  Past Medical History  Diagnosis Date  . Cyst (solitary) of breast   . Medical history non-contributory   . ACL (anterior cruciate ligament) tear right   Past Surgical History  Procedure Laterality Date  . No past surgeries     Family History  Problem Relation Age of Onset  . Diabetes Mellitus II Neg Hx    Social History  Substance Use Topics  . Smoking status: Never Smoker   . Smokeless tobacco: Never Used  . Alcohol Use: No    Review of Systems  Constitutional: Negative for fever.  Musculoskeletal: Negative for arthralgias.  Skin: Positive for color change and wound.  Neurological: Negative for weakness.  All other systems reviewed and are negative.   Allergies  Review of patient's allergies indicates no known allergies.  Home Medications   Prior to Admission medications   Medication Sig Start Date End  Date Taking? Authorizing Provider  doxycycline (VIBRAMYCIN) 100 MG capsule Take 1 capsule (100 mg total) by mouth 2 (two) times daily. 01/18/16   Gloriann Loan, PA-C  HYDROcodone-acetaminophen (NORCO/VICODIN) 5-325 MG tablet Take 1 tablet by mouth every 6 (six) hours as needed. 01/18/16   Gloriann Loan, PA-C  ibuprofen (ADVIL,MOTRIN) 800 MG tablet Take 1 tablet (800 mg total) by mouth 3 (three) times daily. 11/22/15   Domenic Moras, PA-C  methocarbamol (ROBAXIN) 500 MG tablet Take 1-2 tablets (500-1,000 mg total) by mouth every 6 (six) hours as needed for muscle spasms (and pain). 12/22/15   Clayton Bibles, PA-C  traMADol (ULTRAM) 50 MG tablet Take 1 tablet (50 mg total) by mouth every 6 (six) hours as needed for severe pain. 12/20/14   Shanker Kristeen Mans, MD   BP 153/91 mmHg  Pulse 97  Temp(Src) 98.6 F (37 C) (Oral)  Resp 16  Ht 6' 4"  (1.93 m)  Wt 163.295 kg  BMI 43.84 kg/m2  SpO2 96%   Physical Exam  Constitutional: He is oriented to person, place, and time. He appears well-developed and well-nourished. No distress.  Nontoxic appearing  HENT:  Head: Normocephalic and atraumatic.  Eyes: Conjunctivae and EOM are normal. No scleral icterus.  Neck: Normal range of motion.  Cardiovascular: Normal rate, regular rhythm and intact distal pulses.   Distal radial pulse 2+ in the LUE. Capillary refill brisk in all digits of the  L hand.  Pulmonary/Chest: Effort normal. No respiratory distress.  Respirations even and unlabored.  Musculoskeletal: Normal range of motion.       Left forearm: He exhibits tenderness and swelling. He exhibits no bony tenderness and no deformity.       Arms: Neurological: He is alert and oriented to person, place, and time. He exhibits normal muscle tone. Coordination normal.  Sensation to light touch intact in the LUE.  Skin: Skin is warm and dry. No rash noted. He is not diaphoretic. There is erythema. No pallor.  Psychiatric: He has a normal mood and affect. His behavior is normal.   Nursing note and vitals reviewed.   ED Course  Procedures  DIAGNOSTIC STUDIES: Oxygen Saturation is 96% on RA, adequate by my interpretation.    COORDINATION OF CARE: 8:37 PM Discussed treatment plan with pt at bedside which includes blood work and pt agreed to plan.  Results for orders placed or performed during the hospital encounter of 01/21/16  CBC with Differential  Result Value Ref Range   WBC 11.8 (H) 4.0 - 10.5 K/uL   RBC 5.35 4.22 - 5.81 MIL/uL   Hemoglobin 15.7 13.0 - 17.0 g/dL   HCT 45.9 39.0 - 52.0 %   MCV 85.8 78.0 - 100.0 fL   MCH 29.3 26.0 - 34.0 pg   MCHC 34.2 30.0 - 36.0 g/dL   RDW 13.0 11.5 - 15.5 %   Platelets 218 150 - 400 K/uL   Neutrophils Relative % 58 %   Neutro Abs 6.8 1.7 - 7.7 K/uL   Lymphocytes Relative 34 %   Lymphs Abs 4.0 0.7 - 4.0 K/uL   Monocytes Relative 7 %   Monocytes Absolute 0.8 0.1 - 1.0 K/uL   Eosinophils Relative 1 %   Eosinophils Absolute 0.2 0.0 - 0.7 K/uL   Basophils Relative 0 %   Basophils Absolute 0.0 0.0 - 0.1 K/uL  Basic metabolic panel  Result Value Ref Range   Sodium 137 135 - 145 mmol/L   Potassium 4.0 3.5 - 5.1 mmol/L   Chloride 105 101 - 111 mmol/L   CO2 24 22 - 32 mmol/L   Glucose, Bld 91 65 - 99 mg/dL   BUN 18 6 - 20 mg/dL   Creatinine, Ser 1.05 0.61 - 1.24 mg/dL   Calcium 9.7 8.9 - 10.3 mg/dL   GFR calc non Af Amer >60 >60 mL/min   GFR calc Af Amer >60 >60 mL/min   Anion gap 8 5 - 15  Sedimentation rate  Result Value Ref Range   Sed Rate 1 0 - 16 mm/hr    Dg Forearm Left  01/21/2016  CLINICAL DATA:  Lesion draining pus. EXAM: LEFT FOREARM - 2 VIEW COMPARISON:  None. FINDINGS: Soft tissue edema is identified. The skin defect described by the patient is noted with no fluid or gas collection identified on this study. No underlying bony abnormality to suggest osteomyelitis. Soft tissue edema is seen. IMPRESSION: Soft tissue edema with no evidence of osteomyelitis. Electronically Signed   By: Dorise Bullion III  M.D   On: 01/21/2016 21:13    MDM   Final diagnoses:  Cellulitis of left upper extremity    25 year old male presents to the emergency department for evaluation of cellulitis to his left arm. He was seen on 01/18/2016 for similar symptoms and had the area incised and drained at this time. Patient with mild serosanguineous drainage. No purulence There is surrounding induration and erythema. No red linear streaking.  Patient neurovascularly intact.   X-ray negative for subcutaneous gas. ESR is reassuring and mild leukocytosis is consistent with prior evaluations. Patient is also noted to be afebrile and hemodynamically stable. He does not meet SIRS or sepsis criteria.  Symptoms managed in the emergency department with IV vancomycin. Plan to discharge with course of clindamycin. Patient has been instructed to discontinue doxycycline as well as to return for worsening redness or fevers. Return precautions given at discharge. Patient agreeable to plan with no unaddressed concerns. Discharged in satisfactory condition.  I personally performed the services described in this documentation, which was scribed in my presence. The recorded information has been reviewed and is accurate.    Filed Vitals:   01/21/16 1915 01/21/16 2206  BP: 153/91 141/77  Pulse: 97 75  Temp: 98.6 F (37 C)   TempSrc: Oral   Resp: 16 16  Height: 6' 4"  (1.93 m)   Weight: 163.295 kg   SpO2: 96% 100%     Antonietta Breach, PA-C 01/21/16 2330  Lacretia Leigh, MD 01/22/16 0003

## 2016-01-22 LAB — C-REACTIVE PROTEIN: CRP: 2.1 mg/dL — ABNORMAL HIGH (ref <1.0)

## 2017-03-04 ENCOUNTER — Encounter (HOSPITAL_BASED_OUTPATIENT_CLINIC_OR_DEPARTMENT_OTHER): Payer: Self-pay

## 2017-03-04 ENCOUNTER — Emergency Department (HOSPITAL_BASED_OUTPATIENT_CLINIC_OR_DEPARTMENT_OTHER)
Admission: EM | Admit: 2017-03-04 | Discharge: 2017-03-04 | Disposition: A | Discharge status: Home / Self Care | Payer: BLUE CROSS/BLUE SHIELD | Attending: Emergency Medicine | Admitting: Emergency Medicine

## 2017-03-04 DIAGNOSIS — W57XXXA Bitten or stung by nonvenomous insect and other nonvenomous arthropods, initial encounter: Secondary | ICD-10-CM | POA: Insufficient documentation

## 2017-03-04 DIAGNOSIS — Z23 Encounter for immunization: Secondary | ICD-10-CM | POA: Insufficient documentation

## 2017-03-04 DIAGNOSIS — L0291 Cutaneous abscess, unspecified: Secondary | ICD-10-CM

## 2017-03-04 DIAGNOSIS — L02416 Cutaneous abscess of left lower limb: Secondary | ICD-10-CM | POA: Insufficient documentation

## 2017-03-04 MED ORDER — LIDOCAINE HCL (PF) 1 % IJ SOLN: 10.0000 mL | Freq: Once | INTRAMUSCULAR | Status: DC

## 2017-03-04 MED ORDER — TETANUS-DIPHTH-ACELL PERTUSSIS 5-2.5-18.5 LF-MCG/0.5 IM SUSP
0.5000 mL | Freq: Once | INTRAMUSCULAR | Status: AC
  Administered 2017-03-04: 0.5 mL via INTRAMUSCULAR
  Filled 2017-03-04: qty 0.5

## 2017-03-04 MED ORDER — SULFAMETHOXAZOLE-TRIMETHOPRIM 800-160 MG PO TABS: 1.0000 | ORAL_TABLET | Freq: Two times a day (BID) | ORAL | 0 refills | Status: AC

## 2017-03-04 MED ORDER — LIDOCAINE HCL (PF) 1 % IJ SOLN: 5.0000 mL | Freq: Once | INTRAMUSCULAR | Status: DC

## 2017-03-04 MED ORDER — LIDOCAINE HCL 1 % IJ SOLN
INTRAMUSCULAR | Status: AC
  Administered 2017-03-04: 10 mL
  Filled 2017-03-04: qty 10

## 2017-03-04 NOTE — ED Triage Notes (Signed)
C/o ?spider bite to left LE x last week-NAD-steady gait

## 2017-03-04 NOTE — ED Provider Notes (Addendum)
[facility] Provider Note   CSN: [id number] Arrival date & time: 03/04/17  1239     History   Chief Complaint Chief Complaint  Patient presents with  . Insect Bite    HPI Bryce Carroll is a 26 y.o. male.  Patient is a 26 year old male who presents with an abscess to his left leg. He states it started about one week ago. It's been progressively getting larger since that time. It's intermittently draining. Is not sure if he got bitten by something. He's had a similar history of abscesses in the past. He also has a history of a brown recluse spider bite. He denies any fevers. No nausea or vomiting. His tetanus shot is not up-to-date.      Past Medical History:  Diagnosis Date  . ACL (anterior cruciate ligament) tear right  . Cyst (solitary) of breast   . Medical history non-contributory     Patient Active Problem List   Diagnosis Date Noted  . Cellulitis and abscess 12/19/2014  . Cellulitis of chest wall 12/19/2014    Past Surgical History:  Procedure Laterality Date  . NO PAST SURGERIES         Home Medications    Prior to Admission medications   Medication Sig Start Date End Date Taking? Authorizing Provider  sulfamethoxazole-trimethoprim (BACTRIM DS,SEPTRA DS) 800-160 MG tablet Take 1 tablet by mouth 2 (two) times daily. 03/04/17 03/14/17  Rolan Bucco, MD    Family History Family History  Problem Relation Age of Onset  . Diabetes Mellitus II Neg Hx     Social History Social History  Substance Use Topics  . Smoking status: Never Smoker  . Smokeless tobacco: Never Used  . Alcohol use No     Allergies   Patient has no known allergies.   Review of Systems Review of Systems  Constitutional: Negative for fever.  Gastrointestinal: Negative for nausea and vomiting.  Musculoskeletal: Negative for arthralgias, back pain, joint swelling and neck pain.  Skin: Positive for wound.  Neurological: Negative for weakness, numbness and  headaches.     Physical Exam Updated Vital Signs BP 136/72 (BP Location: Left Arm)   Pulse 69   Temp 98.2 F (36.8 C) (Oral)   Resp 18   Ht 6\' 3"  (1.905 m)   Wt (!) 158.3 kg (349 lb)   SpO2 100%   BMI 43.62 kg/m   Physical Exam  Constitutional: He is oriented to person, place, and time. He appears well-developed and well-nourished.  HENT:  Head: Normocephalic and atraumatic.  Neck: Normal range of motion. Neck supple.  Cardiovascular: Normal rate.   Pulmonary/Chest: Effort normal.  Musculoskeletal: He exhibits edema and tenderness.  Patient has a 2 cm indurated area to his left lower leg with a central area of drainage with white purulent drainage. There is a moderate amount of surrounding erythema. No streaking up the leg.  Neurological: He is alert and oriented to person, place, and time.  Skin: Skin is warm and dry.  Psychiatric: He has a normal mood and affect.     ED Treatments / Results  Labs (all labs ordered are listed, but only abnormal results are displayed) Labs Reviewed - No data to display  EKG  EKG Interpretation None       Radiology No results found.  Procedures .Marland KitchenIncision and Drainage Date/Time: 03/04/2017 2:27 PM Performed by: Akeya Ryther Authorized by: Rolan Bucco   Consent:    Consent obtained:  Verbal   Consent given by:  Patient   Risks discussed:  Bleeding, incomplete drainage, infection and pain   Alternatives discussed:  No treatment Location:    Type:  Abscess   Size:  2cm   Location:  Lower extremity   Lower extremity location:  Leg   Leg location:  L lower leg Pre-procedure details:    Skin preparation:  Betadine Anesthesia (see MAR for exact dosages):    Anesthesia method:  Local infiltration   Local anesthetic:  Lidocaine 1% w/o epi Procedure type:    Complexity:  Simple Procedure details:    Incision types:  Elliptical   Incision depth:  Dermal   Scalpel blade:  11   Wound management:  Probed and  deloculated   Drainage:  Purulent   Drainage amount:  Moderate   Wound treatment:  Wound left open   Packing materials:  None Post-procedure details:    Patient tolerance of procedure:  Tolerated well, no immediate complications   (including critical care time)  Medications Ordered in ED Medications  lidocaine (PF) (XYLOCAINE) 1 % injection 10 mL (not administered)  Tdap (BOOSTRIX) injection 0.5 mL (0.5 mLs Intramuscular Given 03/04/17 1356)  lidocaine (XYLOCAINE) 1 % (with pres) injection (10 mLs  Given 03/04/17 1356)     Initial Impression / Assessment and Plan / ED Course  I have reviewed the triage vital signs and the nursing notes.  Pertinent labs & imaging results that were available during my care of the patient were reviewed by me and considered in my medical decision making (see chart for details).     Patient was started on Bactrim. He is advised to do warm compresses. Return precautions were given.  Final Clinical Impressions(s) / ED Diagnoses   Final diagnoses:  Abscess    New Prescriptions New Prescriptions   SULFAMETHOXAZOLE-TRIMETHOPRIM (BACTRIM DS,SEPTRA DS) 800-160 MG TABLET    Take 1 tablet by mouth 2 (two) times daily.     Rolan Bucco, MD 03/04/17 1429    Rolan Bucco, MD 03/04/17 1459

## 2017-10-05 IMAGING — MR MR KNEE*R* W/O CM
4 of 6 series · 18 of 40 positions shown · non-contrast
Comparison: 12/22/2015 radiographs

CLINICAL DATA: Basketball injury 3 months ago, landing on the knee.
Knee instability several weeks ago. Continued pain.

EXAM:
MRI OF THE RIGHT KNEE WITHOUT CONTRAST
TECHNIQUE: Multiplanar, multisequence MR imaging of the knee was performed. No
intravenous contrast was administered.

[Series 4: PD fat-sat · sagittal · 3.0mm · 0.33mm/px · 5 of 32 slices shown (1 of 4)]
[im 1/32]
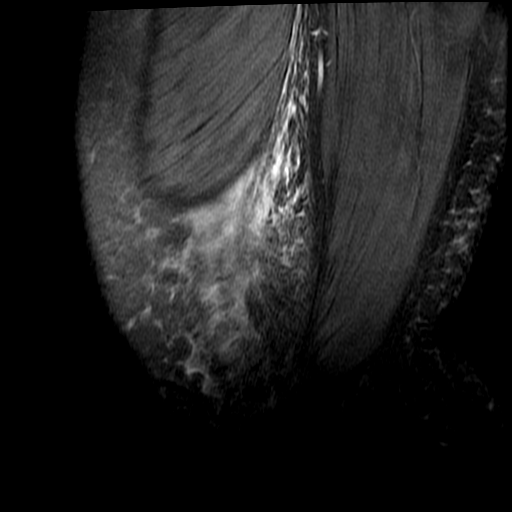
[im 8/32]
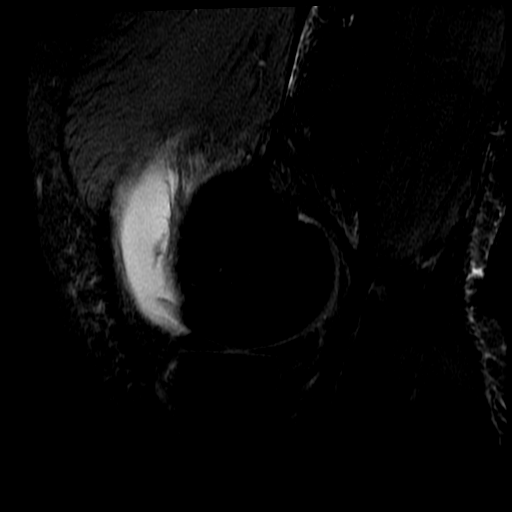
[im 16/32]
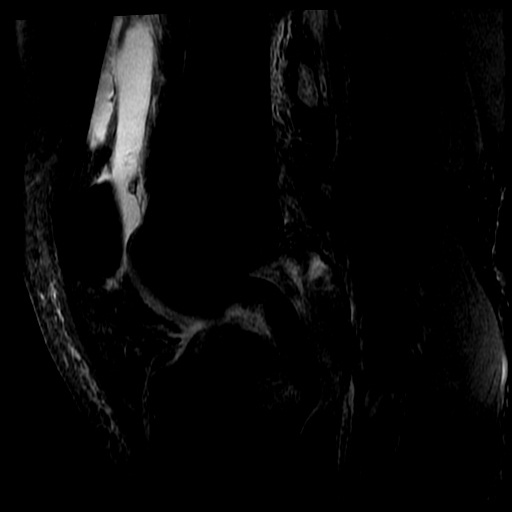
[im 24/32]
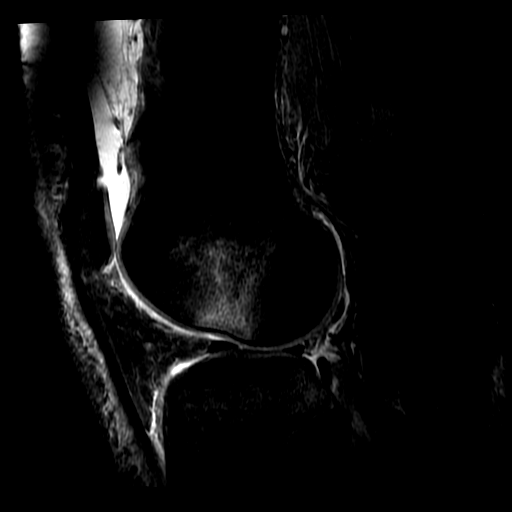
[im 32/32]
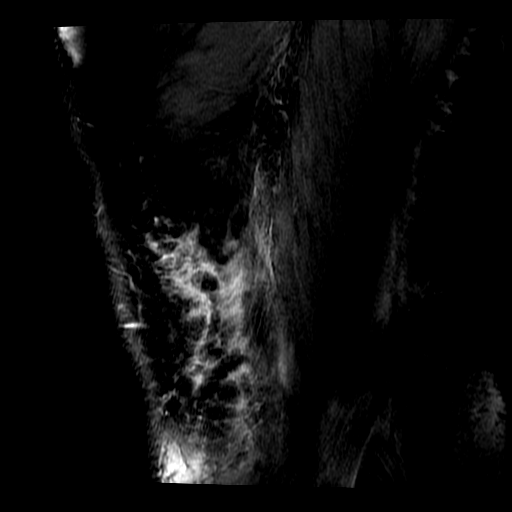

[Series 6: PD fat-sat · axial · 3.0mm · 0.31mm/px · z∈[-69,+64]mm · 7 of 40 slices shown (2 of 4)]
[im 1/40]
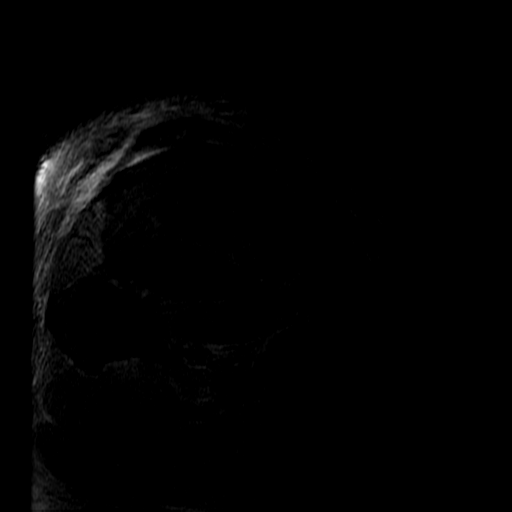
[im 7/40]
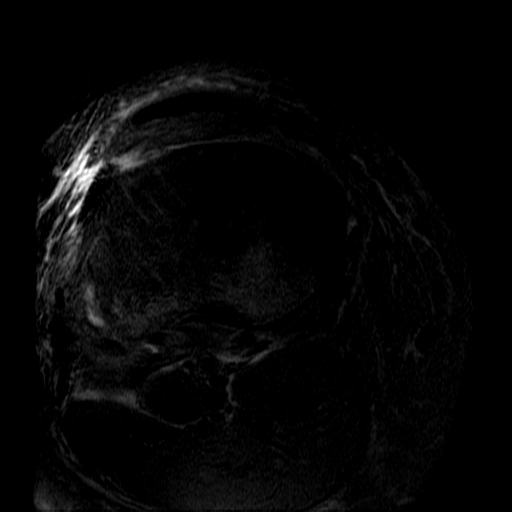
[im 14/40]
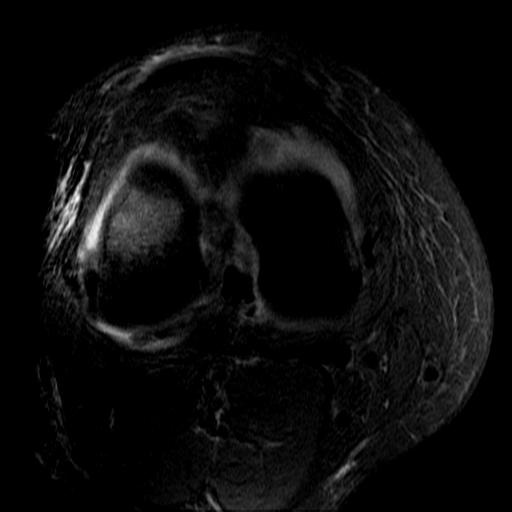
[im 20/40]
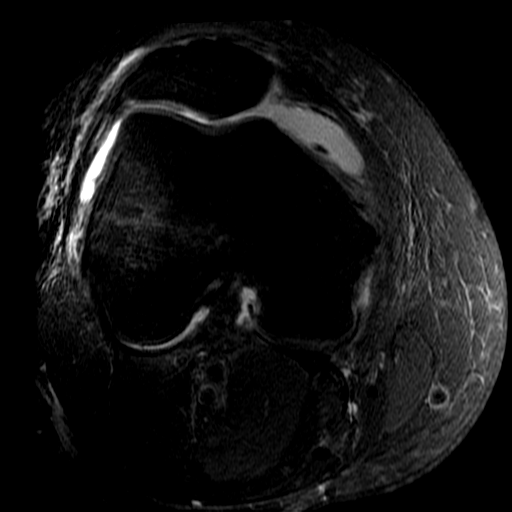
[im 27/40]
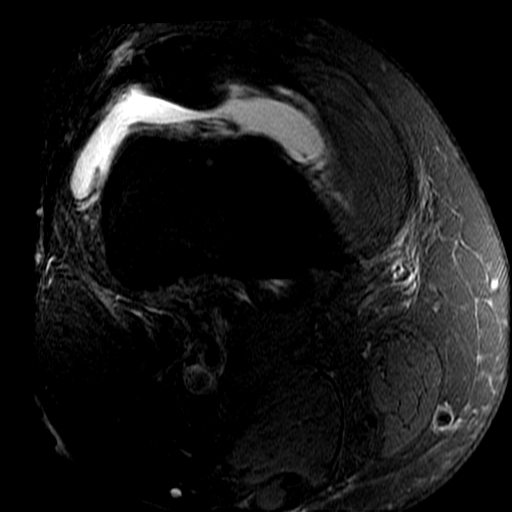
[im 33/40]
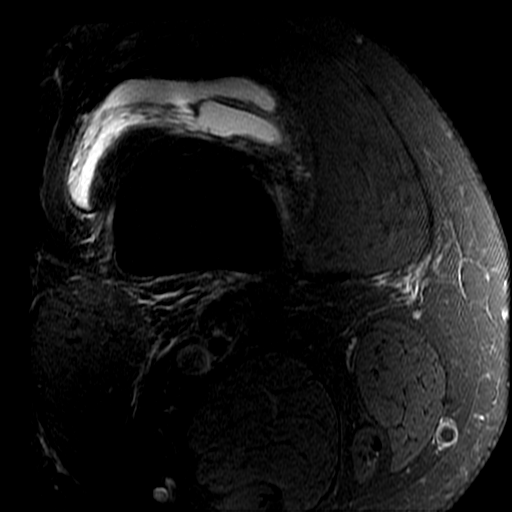
[im 40/40]
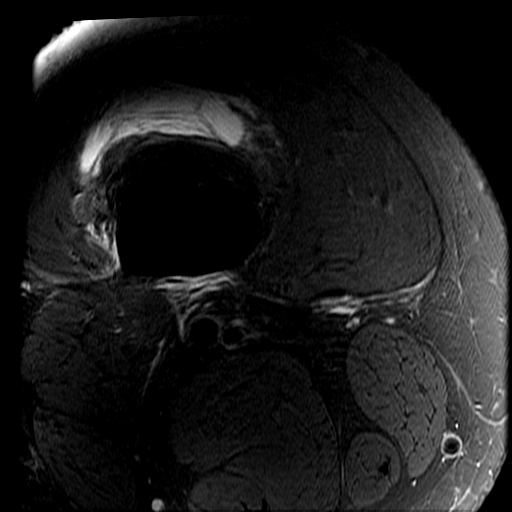

[Series 8: PD fat-sat · coronal · 3.0mm · 0.31mm/px · 3 of 44 slices shown (3 of 4)]
[im 7/44]
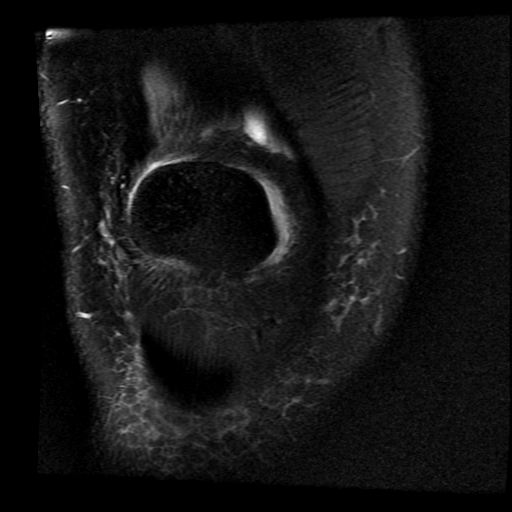
[im 25/44]
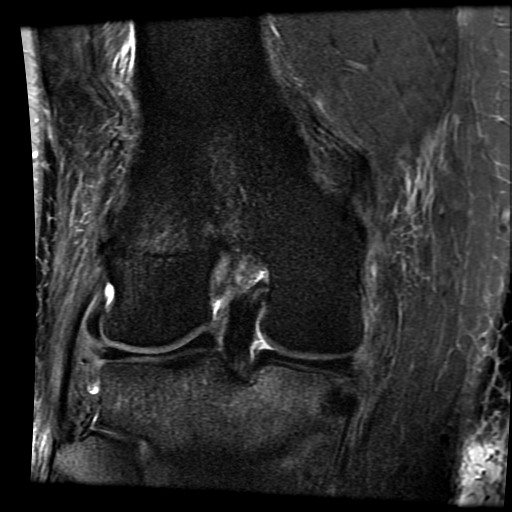
[im 37/44]
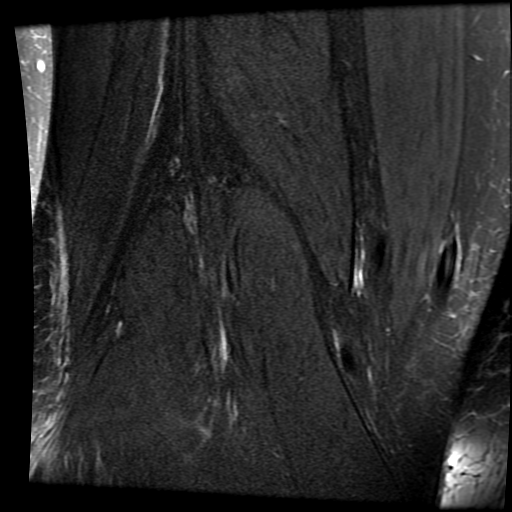

[Series 10: PD fat-sat · coronal · 2.0mm · 0.35mm/px · 3 of 26 slices shown (4 of 4)]
[im 1/26]
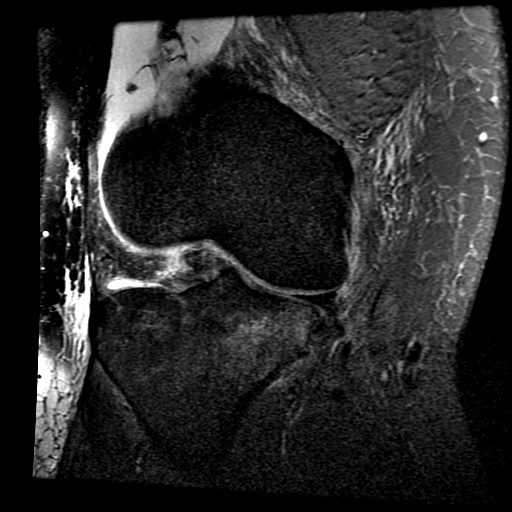
[im 13/26]
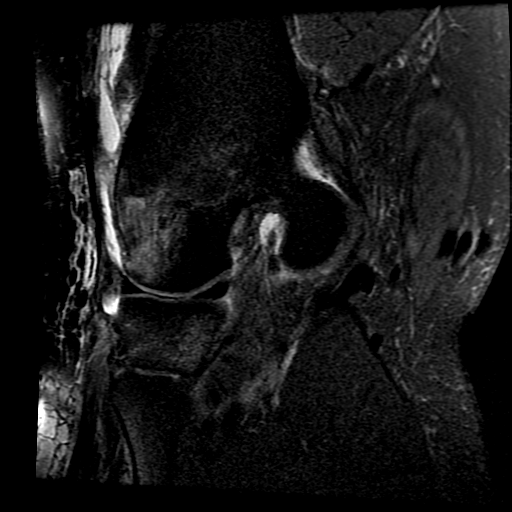
[im 26/26]
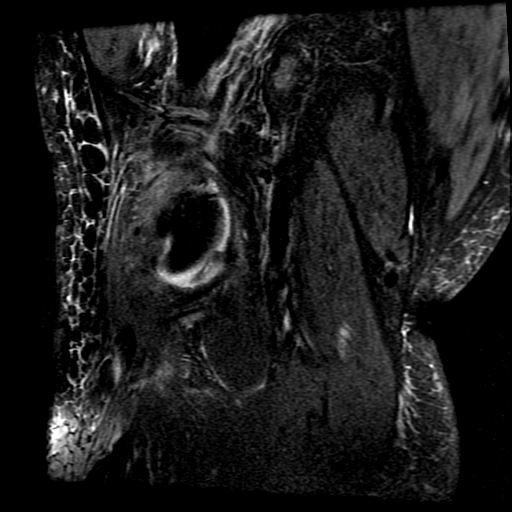

[18 of 40 positions shown; findings below may reference images not displayed]

FINDINGS: MENISCI

Medial meniscus: Irregularity of the periphery of the posterior horn
medial meniscus is specially along the superior surface for example
tear.

Lateral meniscus: Diminutive appearance of the orthotopic portion of
the lateral meniscus. Flap like soft tissue structure extending
cephalad and posterior from the posterior horn lateral meniscus on
images 14 through 18 series 8 could represent a flap of meniscal
tissue extending posteriorly and proximally from the posterior horn,
or conceivably just local synovitis.

LIGAMENTS

Cruciates: The anterior cruciate ligament is completely torn. PCL
intact.

Collaterals:  Unremarkable

CARTILAGE

Patellofemoral:  Unremarkable

Medial: Subcortical stress fracture or nondisplaced fracture along
the posterior rim of the medial tibial plateau, images 22-26 series
4, with surrounding marrow edema.

Lateral: Mild cortical impaction along the anterolateral portion of
the lateral femoral condyle with extensive underlying marrow edema,
image 7 series 4. There is associated bone bruising posteriorly in
the lateral tibial plateau characteristic for pivot-shift mechanism
of injury.

Joint: Moderate to large knee effusion with some complexity and
septation in the suprapatellar bursa. Thickened superior plica. As
noted above, there is either some focal synovitis or a flap of
meniscal tissue posterior to the lateral femoral condyle.

Popliteal Fossa:  Low-level edema in the proximal soleus muscle.

Extensor Mechanism: Edema superficial to the lateral patellar
retinaculum.

Bones: No significant extra-articular osseous abnormalities
identified.

Other: No supplemental non-categorized findings.
IMPRESSION: 1. Completely torn ACL, with anterolateral impaction of the lateral
femoral condyle and bone bruising in the lateral tibial plateau
characteristic a pivot-shift mechanism of injury.
2. Nondisplaced fracture or stress fracture along the posterior rim
of the medial tibial plateau, with surrounding marrow edema.
3. Diminutive orthotopic portion of the lateral meniscus. Posterior
to the lateral femoral condyle there is a flap of tissue which could
be a flap of meniscal tissue (causing the lateral meniscus to appear
small); or synovitis (in the setting of a congenitally diminutive
lateral meniscus).
4. Irregularity of the periphery of the posterior horn medial
meniscus but not a definite tear or meniscocapsular injury.
5. Moderate to large knee effusion with some complexity and
septation in the suprapatellar bursa.
6. Low-level edema in the proximal soleus muscle.
7. Potential grade 1 sprain of the lateral patellar retinaculum.

## 2018-04-21 ENCOUNTER — Emergency Department (HOSPITAL_COMMUNITY)
Admission: EM | Admit: 2018-04-21 | Discharge: 2018-04-21 | Disposition: A | Discharge status: Home / Self Care | Payer: 59 | Attending: Emergency Medicine | Admitting: Emergency Medicine

## 2018-04-21 ENCOUNTER — Encounter (HOSPITAL_COMMUNITY): Payer: Self-pay | Admitting: Nurse Practitioner

## 2018-04-21 ENCOUNTER — Emergency Department (HOSPITAL_COMMUNITY): Payer: 59

## 2018-04-21 DIAGNOSIS — M25512 Pain in left shoulder: Secondary | ICD-10-CM | POA: Insufficient documentation

## 2018-04-21 MED ORDER — HYDROCODONE-ACETAMINOPHEN 5-325 MG PO TABS
2.0000 | ORAL_TABLET | Freq: Once | ORAL | Status: AC
  Administered 2018-04-21: 2 via ORAL
  Filled 2018-04-21: qty 2

## 2018-04-21 MED ORDER — IBUPROFEN 800 MG PO TABS: 800.0000 mg | ORAL_TABLET | Freq: Three times a day (TID) | ORAL | 0 refills | Status: AC | PRN

## 2018-04-21 MED ORDER — HYDROCODONE-ACETAMINOPHEN 5-325 MG PO TABS: 2.0000 | ORAL_TABLET | Freq: Four times a day (QID) | ORAL | 0 refills | Status: AC | PRN

## 2018-04-21 NOTE — ED Notes (Signed)
Bed: WA01 Expected date:  Expected time:  Means of arrival:  Comments: 
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
## 2021-09-12 ENCOUNTER — Encounter (HOSPITAL_COMMUNITY): Payer: Self-pay

## 2021-09-12 ENCOUNTER — Ambulatory Visit (HOSPITAL_COMMUNITY)
Admission: EM | Admit: 2021-09-12 | Discharge: 2021-09-12 | Disposition: A | Discharge status: Home / Self Care | Payer: No Payment, Other | Attending: Psychiatry | Admitting: Psychiatry

## 2021-09-12 ENCOUNTER — Emergency Department (HOSPITAL_COMMUNITY)
Admission: EM | Admit: 2021-09-12 | Discharge: 2021-09-12 | Disposition: A | Discharge status: Home / Self Care | Payer: 59 | Attending: Emergency Medicine | Admitting: Emergency Medicine

## 2021-09-12 DIAGNOSIS — Z634 Disappearance and death of family member: Secondary | ICD-10-CM | POA: Insufficient documentation

## 2021-09-12 DIAGNOSIS — F4323 Adjustment disorder with mixed anxiety and depressed mood: Secondary | ICD-10-CM

## 2021-09-12 DIAGNOSIS — R450 Nervousness: Secondary | ICD-10-CM | POA: Insufficient documentation

## 2021-09-12 DIAGNOSIS — J029 Acute pharyngitis, unspecified: Secondary | ICD-10-CM | POA: Insufficient documentation

## 2021-09-12 DIAGNOSIS — H748X3 Other specified disorders of middle ear and mastoid, bilateral: Secondary | ICD-10-CM | POA: Insufficient documentation

## 2021-09-12 DIAGNOSIS — F4321 Adjustment disorder with depressed mood: Secondary | ICD-10-CM

## 2021-09-12 LAB — GROUP A STREP BY PCR: Group A Strep by PCR: NOT DETECTED

## 2021-09-12 MED ORDER — DEXAMETHASONE SODIUM PHOSPHATE 10 MG/ML IJ SOLN
10.0000 mg | Freq: Once | INTRAMUSCULAR | Status: AC
  Administered 2021-09-12: 10 mg via INTRAMUSCULAR
  Filled 2021-09-12: qty 1

## 2021-09-12 NOTE — ED Triage Notes (Signed)
Pt c/o sore throat, swollen tonsils, ear pain when swallowing, and headaches.   6/10 pain   A/ox4 Ambulatory in triage
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
## 2021-09-12 NOTE — Discharge Instructions (Signed)
You are treated with a shot of steroid here in the ED.  That should help with the sore throat, drink plenty of fluids and take Tylenol Motrin if needed at home.  If things worsen return back to the ED for additional work-up.  Follow-up with ENT if you continue to have pain in your throat or ears.

## 2021-09-12 NOTE — ED Provider Notes (Signed)
[facility]-EMERGENCY DEPT Provider Note   CSN: [id number] Arrival date & time: 09/12/21  1137     History  Chief Complaint  Patient presents with   Sore Throat   Otalgia    Bryce Carroll. is a 31 y.o. male.   Sore Throat  Otalgia   Patient with no PMH  presents with sore throat x 4 days. No N/V. Some ear pain. No SOB, fevers, cough.   Home Medications Prior to Admission medications   Medication Sig Start Date End Date Taking? Authorizing Provider  HYDROcodone-acetaminophen (NORCO/VICODIN) 5-325 MG tablet Take 2 tablets by mouth every 6 (six) hours as needed. 04/21/18   Ward, Layla Maw, DO  ibuprofen (ADVIL,MOTRIN) 800 MG tablet Take 1 tablet (800 mg total) by mouth every 8 (eight) hours as needed for mild pain. 04/21/18   Ward, Layla Maw, DO      Allergies    Patient has no known allergies.    Review of Systems   Review of Systems  HENT:  Positive for ear pain.    Physical Exam Updated Vital Signs BP (!) 149/94    Pulse 68    Temp 98.3 F (36.8 C) (Oral)    Resp 18    SpO2 92%  Physical Exam Vitals and nursing note reviewed. Exam conducted with a chaperone present.  Constitutional:      General: He is not in acute distress.    Appearance: Normal appearance.  HENT:     Head: Normocephalic and atraumatic.     Right Ear: A middle ear effusion is present. Tympanic membrane is not erythematous.     Left Ear: A middle ear effusion is present. Tympanic membrane is not erythematous.     Mouth/Throat:     Pharynx: Uvula midline. Posterior oropharyngeal erythema present. No pharyngeal swelling.     Tonsils: No tonsillar exudate. 3+ on the right. 3+ on the left.  Eyes:     General: No scleral icterus.    Extraocular Movements: Extraocular movements intact.     Pupils: Pupils are equal, round, and reactive to light.  Skin:    Coloration: Skin is not jaundiced.  Neurological:     Mental Status: He is alert. Mental status is at baseline.      Coordination: Coordination normal.    ED Results / Procedures / Treatments   Labs (all labs ordered are listed, but only abnormal results are displayed) Labs Reviewed  GROUP A STREP BY PCR    EKG None  Radiology No results found.  Procedures Procedures    Medications Ordered in ED Medications  dexamethasone (DECADRON) injection 10 mg (10 mg Intramuscular Given 09/12/21 1335)    ED Course/ Medical Decision Making/ A&P                           Medical Decision Making Risk Prescription drug management.   31 year old male presenting with sore throat.  Physical exam does not show any sublingual or submandibular swelling.  Doubt Ludwig angina, uvula is midline, no trismus or palpated voice.  Doubt peritonsillar abscess or retropharyngeal abscess.  Strep ordered in triage is negative for strep pharyngitis.  Will give Decadron to help alleviate patient's sore throat.  No evidence of AOM, there is some fluid but is here.  Advised to continue Flonase.  Do not feel patient needs antibiotics, do not feel additional work-up warranted at this time.  We will send referral for ENT.  Patient also requesting mental health resources, they were provided.  He is not suicidal or homicidal, do not think he needs additional work-up in this regard here in the ED.        Final Clinical Impression(s) / ED Diagnoses Final diagnoses:  Sore throat    Rx / DC Orders ED Discharge Orders     None         Theron Arista, Cordelia Poche 09/12/21 1536    Gloris Manchester, MD 09/13/21 [phone]

## 2021-09-12 NOTE — Discharge Instructions (Addendum)
Outpatient therapy is recommended for grief counseling:    [facility] - offers in person and virtual therapy Address: [address] [place], [place] [address] Phone: [phone]  [facility] - go to [email or web address] to view therapist bios Address: [place], [place] [address] Phone: [phone]  [facility] Address: [place], [place], [place] [address] Phone: [phone]  Please contact one of the following facilities to start medication management and therapy services:   [facility] [address]  [place], [place] [address] [phone]   [facility]  [address] [place], [place] [address] [phone]  [facility]  [address] [place], [place] [address] [phone]  [facility]  [address]  [place], [place] [address] [phone]  [facility]  [address] [place], [place] [address] [phone]  [facility]  [address],  [place], [place] [address] [phone]   For additional options go to : [email or web address] where you can search therapists in the area by specialty, insurance type, areas of focus etc.

## 2021-09-12 NOTE — ED Provider Notes (Signed)
[facility] Medical Screening Exam  Patient Name: Bryce Carroll. MRN: [id number] Date of Evaluation: 09/12/21 Chief Complaint:   Diagnosis:  Final diagnoses:  Adjustment disorder with mixed anxiety and depressed mood  Grief    History of Present illness: Bryce Carroll. is a 31 y.o. male patient presented to [facility] as a walk in  alone with complaints of grief over the loss of his wife 08/09/2021.  Bryce Carroll., 33 y.o., male patient seen face to face by this provider, consulted with Dr. Sherrye Payor; and chart reviewed on 09/12/21.  Patient reports no past psychiatric history.  He is not currently prescribed any medications.  Today patient presented to [facility] ED with complaints of a sore throat.  He was tested for strep and was negative.  He was given a steroid injection.  During the visit he was talking to the emergency room provider about his recent losses.  The provider suggested that he come to [facility] for outpatient psychiatric resources.  Patient states on 08/09/2021 his spouse committed suicide.  At that time his marriage was going through some difficulties. He found his spouse after she passed.  A few days later one of his best friends passed away in a motorcycle accident and his mother passed in 2018.  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 He has no outpatient psychiatric services in place.   During evaluation Bryce Carroll. is in sitting position in no acute distress.  He is well-groomed and pleasant. He is alert/oriented x 4; calm/cooperative; and mood congruent with affect.  He is speaking in a clear tone at moderate volume, and normal pace; with good eye contact.  He endorses depression and anxiety at times mostly when he is alone.  He describes his depression as feeling "numb".  States when he is around other people he is generally happy.  He denies any concerns with appetite or sleep.  Objectively he does not appear to be responding  to internal/external stimuli.  He denies AVH/SI/HI.  He easily contracts for safety.  Reports that he has plenty of support with his family.  He is willing to participate in therapy.  Resources were provided.  At this time Asriel Okon. is educated and verbalizes understanding of mental health resources and other crisis services in the community. He is instructed to call 911 and present to the nearest emergency room should he experience any suicidal/homicidal ideation, auditory/visual/hallucinations, or detrimental worsening of his mental health condition.  He was a also advised by Probation officer that he could call the toll-free phone on insurance card to assist with identifying in network counselors and agencies or number on back of insurance card.    Psychiatric Specialty Exam  Presentation  General Appearance:Appropriate for Environment; Well Groomed  Eye Contact:Good  Speech:Clear and Coherent; Normal Rate  Speech Volume:Normal  Handedness:Right   Mood and Affect  Mood:Depressed; Anxious  Affect:Congruent   Thought Process  Thought Processes:Coherent  Descriptions of Associations:Intact  Orientation:Full (Time, Place and Person)  Thought Content:Logical    Hallucinations:None  Ideas of Reference:None  Suicidal Thoughts:No  Homicidal Thoughts:No   Sensorium  Memory:Immediate Good; Recent Good; Remote Good  Judgment:Good  Insight:Good   Executive Functions  Concentration:Good  Attention Span:Good  Clarinda  Language:Good   Psychomotor Activity  Psychomotor Activity:Normal   Assets  Assets:Communication Skills; Desire for Improvement; Financial Resources/Insurance; Physical Health; Resilience; Social Support;  Talents/Skills   Sleep  Sleep:Fair  Number of hours: 6   No data recorded  Physical Exam: Physical Exam Vitals and nursing note reviewed.  Constitutional:      General: He is not in acute distress.     Appearance: Normal appearance. He is well-developed.  HENT:     Head: Normocephalic.  Eyes:     General:        Right eye: No discharge.        Left eye: No discharge.     Conjunctiva/sclera: Conjunctivae normal.  Cardiovascular:     Rate and Rhythm: Normal rate.     Heart sounds: No murmur heard. Pulmonary:     Effort: Pulmonary effort is normal. No respiratory distress.  Musculoskeletal:        General: No swelling. Normal range of motion.     Cervical back: Normal range of motion.  Neurological:     Mental Status: He is alert and oriented to person, place, and time.  Psychiatric:        Attention and Perception: Perception normal.        Mood and Affect: Mood is anxious and depressed.        Speech: Speech normal.        Behavior: Behavior normal. Behavior is cooperative.        Thought Content: Thought content normal.        Cognition and Memory: Cognition normal.        Judgment: Judgment normal.   Review of Systems  Constitutional: Negative.   HENT: Negative.    Eyes: Negative.   Respiratory: Negative.    Cardiovascular: Negative.   Musculoskeletal: Negative.   Skin: Negative.   Neurological: Negative.   Psychiatric/Behavioral:  Positive for depression. The patient is nervous/anxious.   Blood pressure (!) 141/92, pulse 69, temperature 98.5 F (36.9 C), temperature source Oral, resp. rate 20, SpO2 97 %. There is no height or weight on file to calculate BMI.  Musculoskeletal: Strength & Muscle Tone: within normal limits Gait & Station: normal Patient leans: N/A   [place] MSE Discharge Disposition for Follow up and Recommendations: Based on my evaluation the patient does not appear to have an emergency medical condition and can be discharged with resources and follow up care in outpatient services for Medication Management and Individual Therapy  Discharge patient  Outpatient psychiatric resources were provided.   No evidence of imminent risk to self or others at  present.    Patient does not meet criteria for psychiatric inpatient admission. Discussed crisis plan, support from social network, calling 911, coming to the Emergency Department, and calling Suicide Hotline.    Revonda Humphrey, NP 09/12/2021, 3:40 PM

## 2021-09-25 DIAGNOSIS — Z13220 Encounter for screening for lipoid disorders: Secondary | ICD-10-CM | POA: Diagnosis not present

## 2021-09-25 DIAGNOSIS — Z131 Encounter for screening for diabetes mellitus: Secondary | ICD-10-CM | POA: Diagnosis not present

## 2021-09-25 DIAGNOSIS — F430 Acute stress reaction: Secondary | ICD-10-CM | POA: Diagnosis not present

## 2021-09-25 DIAGNOSIS — J069 Acute upper respiratory infection, unspecified: Secondary | ICD-10-CM | POA: Diagnosis not present

## 2021-09-29 DIAGNOSIS — E559 Vitamin D deficiency, unspecified: Secondary | ICD-10-CM | POA: Diagnosis not present

## 2021-09-29 DIAGNOSIS — J302 Other seasonal allergic rhinitis: Secondary | ICD-10-CM | POA: Diagnosis not present

## 2021-09-29 DIAGNOSIS — Z131 Encounter for screening for diabetes mellitus: Secondary | ICD-10-CM | POA: Diagnosis not present

## 2021-09-29 DIAGNOSIS — G4730 Sleep apnea, unspecified: Secondary | ICD-10-CM | POA: Diagnosis not present

## 2021-10-16 DIAGNOSIS — J302 Other seasonal allergic rhinitis: Secondary | ICD-10-CM | POA: Diagnosis not present

## 2021-12-04 ENCOUNTER — Emergency Department (HOSPITAL_COMMUNITY)
Admission: EM | Admit: 2021-12-04 | Discharge: 2021-12-04 | Disposition: A | Discharge status: Home / Self Care | Payer: Medicaid Other | Attending: Emergency Medicine | Admitting: Emergency Medicine

## 2021-12-04 ENCOUNTER — Other Ambulatory Visit: Payer: Self-pay

## 2021-12-04 ENCOUNTER — Encounter (HOSPITAL_COMMUNITY): Payer: Self-pay

## 2021-12-04 DIAGNOSIS — Y93C2 Activity, hand held interactive electronic device: Secondary | ICD-10-CM | POA: Insufficient documentation

## 2021-12-04 DIAGNOSIS — S0502XA Injury of conjunctiva and corneal abrasion without foreign body, left eye, initial encounter: Secondary | ICD-10-CM | POA: Insufficient documentation

## 2021-12-04 DIAGNOSIS — X58XXXA Exposure to other specified factors, initial encounter: Secondary | ICD-10-CM | POA: Insufficient documentation

## 2021-12-04 MED ORDER — ERYTHROMYCIN 5 MG/GM OP OINT: TOPICAL_OINTMENT | OPHTHALMIC | 0 refills | Status: AC

## 2021-12-04 MED ORDER — FLUORESCEIN SODIUM 1 MG OP STRP
1.0000 | ORAL_STRIP | Freq: Once | OPHTHALMIC | Status: AC
  Administered 2021-12-04: 1 via OPHTHALMIC
  Filled 2021-12-04: qty 1

## 2021-12-04 MED ORDER — TETRACAINE HCL 0.5 % OP SOLN
2.0000 [drp] | Freq: Once | OPHTHALMIC | Status: AC
  Administered 2021-12-04: 2 [drp] via OPHTHALMIC
  Filled 2021-12-04: qty 4

## 2021-12-04 NOTE — ED Triage Notes (Signed)
Pt presents to ED from home, states while watching the game tonight he felt something go into his left eye. Pt's left eye noted to be red and watery.

## 2021-12-04 NOTE — Discharge Instructions (Addendum)
You have an abrasion on your cornea, I have started you on antibiotics please take as prescribed.  You may use over-the-counter pain medication as needed, you may also apply eye lubrication which you buy over-the-counter to help with eye pain.  Please follow-up with your ophthalmologist or the one that I have provided to you.  Please call today for a follow-up appointment.    Come back to the emergency department if you develop chest pain, shortness of breath, severe abdominal pain, uncontrolled nausea, vomiting, diarrhea.

## 2021-12-04 NOTE — ED Provider Notes (Signed)
[facility] DEPT Provider Note   CSN: [id number] Arrival date & time: 12/04/21  0340     History  Chief Complaint  Patient presents with   Eye Pain    Bryce Steffy. is a 31 y.o. male.  HPI  Without significant medical history presents with complaints of left eye pain.  Patient states that earlier in the day Bryce Carroll had poked his eye with a hair comb, Bryce Carroll states after the incident Bryce Carroll was having no pain but later in the evening while Bryce Carroll was watching TV Bryce Carroll felt like something got into his eye and Bryce Carroll was having pain, states Bryce Carroll feels a gritty like sensation in his upper left eyelid, is worsened when Bryce Carroll blinks his eyes or moves his eye, Bryce Carroll denies any visual changes.  Bryce Carroll states Bryce Carroll went to sleep and woke up with still continuing pain.  Bryce Carroll does not wear contacts, Bryce Carroll is never had this in the past.  Bryce Carroll has no other complaints.    Home Medications Prior to Admission medications   Medication Sig Start Date End Date Taking? Authorizing Provider  erythromycin ophthalmic ointment Place a 1/2 inch ribbon of ointment into the lower eyelid.  4 times daily for the next 5 days. 12/04/21  Yes Marcello Fennel, PA-C  HYDROcodone-acetaminophen (NORCO/VICODIN) 5-325 MG tablet Take 2 tablets by mouth every 6 (six) hours as needed. 04/21/18   Ward, Delice Bison, DO  ibuprofen (ADVIL,MOTRIN) 800 MG tablet Take 1 tablet (800 mg total) by mouth every 8 (eight) hours as needed for mild pain. 04/21/18   Ward, Delice Bison, DO      Allergies    Patient has no known allergies.    Review of Systems   Review of Systems  Constitutional:  Negative for chills and fever.  Eyes:  Positive for pain and discharge. Negative for redness and visual disturbance.  Respiratory:  Negative for shortness of breath.   Cardiovascular:  Negative for chest pain.  Gastrointestinal:  Negative for abdominal pain.  Neurological:  Negative for headaches.   Physical Exam Updated Vital Signs BP (!) 179/100 (BP  Location: Left Arm)   Pulse 90   Temp 97.9 F (36.6 C) (Oral)   Resp 16   Ht 6\' 3"  (1.905 m)   Wt (!) 167.8 kg   SpO2 92%   BMI 46.25 kg/m  Physical Exam Vitals and nursing note reviewed.  Constitutional:      General: Bryce Carroll is not in acute distress.    Appearance: Bryce Carroll is not ill-appearing.  HENT:     Head: Normocephalic and atraumatic.     Nose: No congestion.  Eyes:     Extraocular Movements: Extraocular movements intact.     Conjunctiva/sclera: Conjunctivae normal.     Pupils: Pupils are equal, round, and reactive to light.     Comments: No proptosis, no erythema around the periorbital region, patient had some clearish discharge from the left eye, there is no scleral injection, PERRLA, EOMs fully intact, Bryce Carroll has a small defect on the 12 o'clock position of the cornea, eyelid was inverted there is no foreign bodies present, upper eyelid was flushed without improvement of symptoms.  There is no blood noted in anterior chamber the eye.  Cardiovascular:     Rate and Rhythm: Normal rate and regular rhythm.  Pulmonary:     Effort: Pulmonary effort is normal.  Skin:    General: Skin is warm and dry.  Neurological:     Mental Status:  Bryce Carroll is alert.  Psychiatric:        Mood and Affect: Mood normal.    ED Results / Procedures / Treatments   Labs (all labs ordered are listed, but only abnormal results are displayed) Labs Reviewed - No data to display  EKG None  Radiology No results found.  Procedures Procedures    Medications Ordered in ED Medications  tetracaine (PONTOCAINE) 0.5 % ophthalmic solution 2 drop (has no administration in time range)  fluorescein ophthalmic strip 1 strip (has no administration in time range)    ED Course/ Medical Decision Making/ A&P                           Medical Decision Making Risk Prescription drug management.   This patient presents to the ED for concern of left eye pain, this involves an extensive number of treatment options, and  is a complaint that carries with it a high risk of complications and morbidity.  The differential diagnosis includes corneal abrasion, globe rupture, conjunctivitis    Additional history obtained:  Additional history obtained from N/A External records from outside source obtained and reviewed including previous ER notes   Co morbidities that complicate the patient evaluation  Obesity  Social Determinants of Health:  N/A    Lab Tests:  I Ordered, and personally interpreted labs.  The pertinent results include:   Intraocular pressure left 15 Intraocular pressure right 17 Fluorescein stain reveals uptake at the 12 o'clock position left eye consistent with a corneal abrasion.   Visual Acuity  Right Eye Distance: 20/70 Left Eye Distance: 20/200      Imaging Studies ordered:  I ordered imaging studies including N/A I independently visualized and interpreted imaging which showed N/A I agree with the radiologist interpretation   Cardiac Monitoring:  The patient was maintained on a cardiac monitor.  I personally viewed and interpreted the cardiac monitored which showed an underlying rhythm of: N/A   Medicines ordered and prescription drug management:  I ordered medication including tetracaine I have reviewed the patients home medicines and have made adjustments as needed  Critical Interventions:  N/A   Reevaluation:  Left thigh pain, on evaluation.  She has a defect in the left cornea, suspect patient starting from a corneal abrasion, will obtain visual acuity, evaluate under Woods lamp, and obtain intraocular pressures.  Intraocular pressures were unremarkable, Woods lamp exam reveals a corneal abrasion, will start him on antibiotics follow-up with his ophthalmologist Bryce Carroll is in agreement this plan is ready for discharge.    Consultations Obtained:  N/A   Test Considered:  N/A    Rule out I have low suspicion for orbital cellulitis or periseptal  cellulitis as patient had no pain with EOMs, there is no induration, fluctuance noted on exam around eyes.  Low suspicion for keratitis as patient does not wear contacts, eyes were visualized there is no visible dendritic lesion noted, there is no severe amount of purulent discharge noted.  Low suspicion for globe rupture as intraocular pressures are both within normal limits.  Low suspicion for hyphema as patient denies recent trauma to the area no blood noted in the anterior chamber.      Dispostion and problem list  After consideration of the diagnostic results and the patients response to treatment, I feel that the patent would benefit from discharge.  Corneal abrasion- will start patient on antibiotics, recommends eye lubrication, over-the-counter pain medications, follow-up with ophthalmologist for further  evaluation and strict return precautions.            Final Clinical Impression(s) / ED Diagnoses Final diagnoses:  Abrasion of left cornea, initial encounter    Rx / DC Orders ED Discharge Orders          Ordered    erythromycin ophthalmic ointment        12/04/21 0448              Marcello Fennel, PA-C 12/04/21 0457    Fatima Blank, MD 12/04/21 [phone]

## 2021-12-04 NOTE — ED Notes (Signed)
Pt states he wears glasses, but did not have them for accurate visual acuity screening.

## 2023-06-26 ENCOUNTER — Ambulatory Visit (HOSPITAL_COMMUNITY)
Admission: EM | Admit: 2023-06-26 | Discharge: 2023-06-26 | Disposition: A | Discharge status: Home / Self Care | Payer: Medicaid Other | Attending: Family Medicine | Admitting: Family Medicine

## 2023-06-26 ENCOUNTER — Other Ambulatory Visit: Payer: Self-pay

## 2023-06-26 DIAGNOSIS — J22 Unspecified acute lower respiratory infection: Secondary | ICD-10-CM

## 2023-06-26 MED ORDER — ALBUTEROL SULFATE HFA 108 (90 BASE) MCG/ACT IN AERS
INHALATION_SPRAY | RESPIRATORY_TRACT | Status: AC
  Filled 2023-06-26: qty 6.7

## 2023-06-26 MED ORDER — AMOXICILLIN 875 MG PO TABS: 875.0000 mg | ORAL_TABLET | Freq: Two times a day (BID) | ORAL | 0 refills | Status: AC

## 2023-06-26 MED ORDER — ALBUTEROL SULFATE HFA 108 (90 BASE) MCG/ACT IN AERS
2.0000 | INHALATION_SPRAY | Freq: Once | RESPIRATORY_TRACT | Status: AC
  Administered 2023-06-26: 2 via RESPIRATORY_TRACT

## 2023-06-26 MED ORDER — PREDNISONE 20 MG PO TABS: 40.0000 mg | ORAL_TABLET | Freq: Every day | ORAL | 0 refills | Status: AC

## 2023-06-26 MED ORDER — ALBUTEROL SULFATE (2.5 MG/3ML) 0.083% IN NEBU
INHALATION_SOLUTION | RESPIRATORY_TRACT | Status: AC
  Filled 2023-06-26: qty 3

## 2023-06-26 NOTE — ED Triage Notes (Signed)
Patient arrives with complaints of hoarseness, cough, and congestion x1 week.  Patient reports taking Mucinex at home with minimal relief.

## 2023-06-26 NOTE — ED Provider Notes (Signed)
[facility]    CSN: [id number] Arrival date & time: 06/26/23  6578      History   Chief Complaint Chief Complaint  Patient presents with   Cough   Nasal Congestion    HPI Bryce Degrand. is a 32 y.o. male.  Patient presents today with a 1 week history of worsening cough, congestion and intermittent periods of shortness of breath and chest tightness with nasal congestion.  Patient is unaware if he had any fever.  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  He has been taken over-the-counter medication without any relief of symptoms.  He denies any history of asthma.  Past Medical History:  Diagnosis Date   ACL (anterior cruciate ligament) tear right   Cyst (solitary) of breast    Medical history non-contributory     Patient Active Problem List   Diagnosis Date Noted   Cellulitis and abscess 12/19/2014   Cellulitis of chest wall 12/19/2014    Past Surgical History:  Procedure Laterality Date   NO PAST SURGERIES         Home Medications    Prior to Admission medications   Medication Sig Start Date End Date Taking? Authorizing Provider  amoxicillin (AMOXIL) 875 MG tablet Take 1 tablet (875 mg total) by mouth 2 (two) times daily. 06/26/23  Yes Bing Neighbors, NP  predniSONE (DELTASONE) 20 MG tablet Take 2 tablets (40 mg total) by mouth daily with breakfast. 06/26/23  Yes Bing Neighbors, NP  erythromycin ophthalmic ointment Place a 1/2 inch ribbon of ointment into the lower eyelid.  4 times daily for the next 5 days. 12/04/21   Carroll Sage, PA-C  HYDROcodone-acetaminophen (NORCO/VICODIN) 5-325 MG tablet Take 2 tablets by mouth every 6 (six) hours as needed. 04/21/18   Ward, Layla Maw, DO  ibuprofen (ADVIL,MOTRIN) 800 MG tablet Take 1 tablet (800 mg total) by mouth every 8 (eight) hours as needed for mild pain. 04/21/18   Ward, Layla Maw, DO     Family History Family History  Problem Relation Age of Onset   Diabetes Mellitus II Neg Hx     Social History Social History   Tobacco Use   Smoking status: Never   Smokeless tobacco: Never  Substance Use Topics   Alcohol use: No   Drug use: No     Allergies   Patient has no known allergies.   Review of Systems Review of Systems  HENT:  Positive for congestion.   Respiratory:  Positive for cough and shortness of breath.      Physical Exam Triage Vital Signs ED Triage Vitals  Encounter Vitals Group     BP 06/26/23 1123 (!) 146/82     Systolic BP Percentile --      Diastolic BP Percentile --      Pulse Rate 06/26/23 1123 78     Resp 06/26/23 1123 20     Temp 06/26/23 1123 98.3 F (36.8 C)     Temp Source 06/26/23 1123 Oral     SpO2 06/26/23 1123 95 %     Weight 06/26/23 1125 (!) 370 lb (167.8 kg)     Height 06/26/23 1125 6\' 3"  (1.905 m)     Head Circumference --      Peak Flow --      Pain Score 06/26/23 1125 0     Pain Loc --  Pain Education --      Exclude from Growth Chart --    No data found.  Updated Vital Signs BP (!) 146/82   Pulse 78   Temp 98.3 F (36.8 C) (Oral)   Resp 20   Ht 6\' 3"  (1.905 m)   Wt (!) 370 lb (167.8 kg)   SpO2 95%   BMI 46.25 kg/m   Visual Acuity Right Eye Distance:   Left Eye Distance:   Bilateral Distance:    Right Eye Near:   Left Eye Near:    Bilateral Near:     Physical Exam Vitals reviewed.  Constitutional:      Appearance: He is ill-appearing.  HENT:     Head: Normocephalic and atraumatic.     Right Ear: External ear normal.     Left Ear: External ear normal.     Nose: Congestion and rhinorrhea present.     Mouth/Throat:     Mouth: Mucous membranes are moist.     Pharynx: No oropharyngeal exudate or posterior oropharyngeal erythema.  Eyes:     Extraocular Movements: Extraocular movements intact.     Conjunctiva/sclera: Conjunctivae normal.     Pupils: Pupils are equal, round, and reactive  to light.  Cardiovascular:     Rate and Rhythm: Normal rate.  Pulmonary:     Effort: Pulmonary effort is normal.     Breath sounds: Decreased air movement present. Rhonchi present. No wheezing.  Musculoskeletal:        General: Normal range of motion.     Cervical back: Normal range of motion and neck supple.  Lymphadenopathy:     Cervical: No cervical adenopathy.  Skin:    General: Skin is warm and dry.  Neurological:     General: No focal deficit present.     Mental Status: He is alert.      UC Treatments / Results  Labs (all labs ordered are listed, but only abnormal results are displayed) Labs Reviewed - No data to display  EKG   Radiology No results found.  Procedures Procedures (including critical care time)  Medications Ordered in UC Medications  albuterol (VENTOLIN HFA) 108 (90 Base) MCG/ACT inhaler 2 puff (2 puffs Inhalation Given 06/26/23 1220)    Initial Impression / Assessment and Plan / UC Course  I have reviewed the triage vital signs and the nursing notes.  Pertinent labs & imaging results that were available during my care of the patient were reviewed by me and considered in my medical decision making (see chart for details).    Acute lower respiratory infection, treat empirically with amoxicillin 875 twice daily for 10 days.  Also treating with prednisone 40 mg daily for 5 days to decrease inflammation in chest which will increase improved work of breathing and cough.  I also prescribed albuterol inhaler for use as needed if any wheezing or chest tightness or worsening of shortness of breath develop.  Patient verbalized understanding and agreement with plan. Final Clinical Impressions(s) / UC Diagnoses   Final diagnoses:  Acute lower respiratory infection     Discharge Instructions      Take the patient as prescribed.  I am also prescribing you an albuterol inhaler 2 puffs every 4-6 hours as needed for any wheezing or chest tightness.  You may  return back to work tomorrow as long as you remain fever free.  If at any point your symptoms worsen return for evaluation.   ED Prescriptions     Medication Sig  Dispense Auth. Provider   predniSONE (DELTASONE) 20 MG tablet Take 2 tablets (40 mg total) by mouth daily with breakfast. 10 tablet Bing Neighbors, NP   amoxicillin (AMOXIL) 875 MG tablet Take 1 tablet (875 mg total) by mouth 2 (two) times daily. 20 tablet Bing Neighbors, NP      PDMP not reviewed this encounter.   Bing Neighbors, NP 06/26/23 1356

## 2023-06-26 NOTE — Discharge Instructions (Signed)
Take the patient as prescribed.  I am also prescribing you an albuterol inhaler 2 puffs every 4-6 hours as needed for any wheezing or chest tightness.  You may return back to work tomorrow as long as you remain fever free.  If at any point your symptoms worsen return for evaluation.

## 2023-10-24 ENCOUNTER — Ambulatory Visit: Payer: Self-pay | Admitting: Allergy
